# Patient Record
Sex: Female | Born: 1960 | Race: White | Hispanic: No | Marital: Married | Smoking: Never smoker
Health system: Southern US, Community
[De-identification: ages and names within clinical notes are randomized; demographics above are authoritative.]

## PROBLEM LIST (undated history)

## (undated) DIAGNOSIS — M797 Fibromyalgia: Secondary | ICD-10-CM

## (undated) DIAGNOSIS — R002 Palpitations: Secondary | ICD-10-CM

## (undated) DIAGNOSIS — Z780 Asymptomatic menopausal state: Secondary | ICD-10-CM

## (undated) DIAGNOSIS — Z464 Encounter for fitting and adjustment of orthodontic device: Secondary | ICD-10-CM

## (undated) DIAGNOSIS — T753XXA Motion sickness, initial encounter: Secondary | ICD-10-CM

## (undated) DIAGNOSIS — E785 Hyperlipidemia, unspecified: Secondary | ICD-10-CM

## (undated) DIAGNOSIS — R112 Nausea with vomiting, unspecified: Secondary | ICD-10-CM

## (undated) DIAGNOSIS — F32A Depression, unspecified: Secondary | ICD-10-CM

## (undated) DIAGNOSIS — M539 Dorsopathy, unspecified: Secondary | ICD-10-CM

## (undated) DIAGNOSIS — Z973 Presence of spectacles and contact lenses: Secondary | ICD-10-CM

## (undated) DIAGNOSIS — G4730 Sleep apnea, unspecified: Secondary | ICD-10-CM

## (undated) DIAGNOSIS — E559 Vitamin D deficiency, unspecified: Secondary | ICD-10-CM

## (undated) DIAGNOSIS — Z8489 Family history of other specified conditions: Secondary | ICD-10-CM

## (undated) DIAGNOSIS — IMO0001 Reserved for inherently not codable concepts without codable children: Secondary | ICD-10-CM

## (undated) DIAGNOSIS — F329 Major depressive disorder, single episode, unspecified: Secondary | ICD-10-CM

## (undated) DIAGNOSIS — Z9889 Other specified postprocedural states: Secondary | ICD-10-CM

## (undated) HISTORY — DX: Fibromyalgia: M79.7

## (undated) HISTORY — DX: Vitamin D deficiency, unspecified: E55.9

## (undated) HISTORY — DX: Palpitations: R00.2

## (undated) HISTORY — PX: ANTERIOR CERVICAL DECOMP/DISCECTOMY FUSION: SHX1161

## (undated) HISTORY — DX: Hyperlipidemia, unspecified: E78.5

## (undated) HISTORY — DX: Asymptomatic menopausal state: Z78.0

## (undated) HISTORY — DX: Depression, unspecified: F32.A

## (undated) HISTORY — DX: Major depressive disorder, single episode, unspecified: F32.9

## (undated) HISTORY — PX: TONSILLECTOMY: SHX5217

---

## 1998-08-10 ENCOUNTER — Other Ambulatory Visit: Admission: RE | Admit: 1998-08-10 | Discharge: 1998-08-10 | Payer: Self-pay | Admitting: Obstetrics & Gynecology

## 2001-03-19 ENCOUNTER — Other Ambulatory Visit: Admission: RE | Admit: 2001-03-19 | Discharge: 2001-03-19 | Payer: Self-pay | Admitting: Obstetrics & Gynecology

## 2002-07-22 ENCOUNTER — Ambulatory Visit (HOSPITAL_COMMUNITY): Admission: RE | Admit: 2002-07-22 | Discharge: 2002-07-22 | Payer: Self-pay | Admitting: Internal Medicine

## 2002-07-24 ENCOUNTER — Encounter: Payer: Self-pay | Admitting: *Deleted

## 2002-09-09 ENCOUNTER — Other Ambulatory Visit: Admission: RE | Admit: 2002-09-09 | Discharge: 2002-09-09 | Payer: Self-pay | Admitting: Obstetrics & Gynecology

## 2004-02-17 ENCOUNTER — Ambulatory Visit (HOSPITAL_COMMUNITY): Admission: RE | Admit: 2004-02-17 | Discharge: 2004-02-18 | Payer: Self-pay | Admitting: Neurosurgery

## 2007-07-08 ENCOUNTER — Observation Stay (HOSPITAL_COMMUNITY): Admission: RE | Admit: 2007-07-08 | Discharge: 2007-07-09 | Payer: Self-pay | Admitting: Neurosurgery

## 2009-08-21 ENCOUNTER — Ambulatory Visit: Payer: Self-pay | Admitting: Internal Medicine

## 2010-12-12 NOTE — Op Note (Signed)
NAMEHARMANI, Figueroa NO.:  [id number]   MEDICAL RECORD NO.:  [id number]          PATIENT TYPE:  INP   LOCATION:  2899                         FACILITY:  [facility]   PHYSICIAN:  Danae Orleans. Venetia Maxon, M.D.  DATE OF BIRTH:  1961-02-23   DATE OF PROCEDURE:  07/08/2007  DATE OF DISCHARGE:                               OPERATIVE REPORT   PREOPERATIVE DIAGNOSIS:  Cervical spondylosis with myelopathy, cervical  disc herniation with myelopathy, cervical radiculopathy, neck pain, C4-  C5 and C6-C7, with prior fusion C5-C6.   POSTOPERATIVE DIAGNOSIS:  Cervical spondylosis with myelopathy, cervical  disc herniation with myelopathy, cervical radiculopathy, neck pain, C4-  C5 and C6-C7, with prior fusion C5-C6.   PROCEDURE:  Exploration of fusion C5-C6, with removal of previously  placed hardware at this level and anterior cervical decompression and  fusion C4-C5 and C6-C7 with allograft bone graft autograft and anterior  cervical plates.   SURGEON:  Danae Orleans. Venetia Maxon, M.D.   ASSISTANT:  Hewitt Shorts, M.D.   ANESTHESIA:  General endotracheal anesthesia.   ESTIMATED BLOOD LOSS:  Minimal.   COMPLICATIONS:  None.   DISPOSITION:  Recovery room.   INDICATIONS FOR PROCEDURE:  Cheryl Figueroa is a 50 year old woman who  has previously undergone an anterior cervical decompression and fusion  at C5-C6.  She did well after the surgery but multiple years later has  developed significant neck and bilateral upper extremity pain with  myelopathy and was found to have significant spondylosis and disc  herniation with cord compression at C4-C5 and C6-C7 levels.  It was  elected to take her to surgery for exploration, fusion, and removal of  previous hardware with anterior cervical decompression and fusion C4-C5  and C6-C7 levels.   PROCEDURE:  Cheryl Figueroa was brought to the operating room.  Following  the satisfactory and uncomplicated induction of general endotracheal  anesthesia  and placement of intravenous lines, the patient was placed in  a supine position on the operating table.  Her neck was maintained in  neutral alignment.  She was placed in 5 pounds of halter traction.  Her  anterior neck was then prepped and draped in the usual sterile fashion.  The previous area of planned incision was infiltrated with 0.25%  Marcaine and 0.5% lidocaine with 1:200,000 epinephrine.  An incision was  made from the midline to the anterior border of the sternocleidomastoid  muscle through her previous incision and carried sharply through the  platysmal layer.  Subplatysmal dissection was performed exposing the  anterior border of the sternocleidomastoid muscle using blunt  dissection.  The carotid sheath was kept lateral, the trachea and  esophagus kept medial, exposing the anterior cervical spine.  A previous  plate, which was encased in scar tissue, was exposed and subsequently  removed.  The bone graft interfaces with the previous fusion were  identified and inspected carefully under loupe magnification and there  appeared to be a solid arthrodesis with no evidence of movement.  An x-  ray was obtained with a marker probe at the C4-C5 level.  Subsequently,  initially at C6-C7 and then at C4-C5,  the longus colli muscles were  taken down from the anterior cervical spine and ventral osteophytes were  removed, the disc spaces were incised, and disc material was removed in  a piecemeal fashion.  Distraction pins were placed, initially at C6-C7  and subsequently at C4 and C5, and the interspace was further evacuated  of disc material.  A high speed drill was used with suction trap and the  interspaces decorticated and uncinate spurs were drilled down.  Subsequently, under the microscope, the posterior longitudinal ligament  was decompressed and removed along with uncinate spurs and the spinal  cord dura and both C7 nerve roots were widely decompressed.  Hemostasis  was assured  and after trial sizing, a 7 mm allograft bone graft was  selected, fashioned with a high speed drill, packed with morcellized  bone autograft, and inserted in the interspace and counter sunk  appropriately.  Attention was turned to the C4-C5 level where a similar  decompression was performed and, at this level, again the interspace was  evacuated and endplates decorticated and uncinate spurs drilled down.  The posterior longitudinal ligament was incised and removed in a  piecemeal fashion with significant decompression of the spinal cord dura  and both C5 neural foramina.  Hemostasis was again assured and, after  trial sizing a 6 mm, allograft bone wedge was selected, fashion with a  high speed drill, packed with morcellized bone autograft, inserted into  the interspace, and counter sunk appropriately.  Two 14 mm tressel  plates were then placed, one at C4-C5 and the other at C6-C7 with 14 mm  screws.  All screws had excellent purchase.  Locking mechanisms were  engaged.  Final x-ray demonstrated well positioned upper aspect but it  was not possible to visualize the lower aspect of the construct.  The  traction layer was removed prior to placing the plate.  Hemostasis was  then assured.  The soft tissue was inspected and found to be in good  repair with no evidence of any bleeding.  The platysmal layer was closed  with 3-0 Vicryl sutures and the skin edges were reapproximated with 3-0  Vicryl interrupted inverted sutures.  The wound was dressed with  Dermabond.  The patient was extubated in the operating room and taken to  the recovery room in stable condition having tolerated the operation  well.  Counts were correct at the end of the case.      Danae Orleans. Venetia Maxon, M.D.  Electronically Signed     JDS/MEDQ  D:  07/08/2007  T:  07/08/2007  Job:  [id number]

## 2010-12-15 NOTE — Op Note (Signed)
NAME:  Cheryl Figueroa, Cheryl Figueroa                      ACCOUNT NO.:  [id number]   MEDICAL RECORD NO.:  [id number]                   PATIENT TYPE:  OIB   LOCATION:  2891                                 FACILITY:  [facility]   PHYSICIAN:  Danae Orleans. Venetia Maxon, M.D.               DATE OF BIRTH:  1961/04/02   DATE OF PROCEDURE:  02/17/2004  DATE OF DISCHARGE:                                 OPERATIVE REPORT   PREOPERATIVE DIAGNOSIS:  Herniated cervical disk with cervical spondylosis,  spinal stenosis, degenerative disk disease and cervical radiculopathy, C5-6  level.   POSTOPERATIVE DIAGNOSIS:  Herniated cervical disk with cervical spondylosis,  spinal stenosis, degenerative disk disease and cervical radiculopathy, C5-6  level.   PROCEDURE:  Anterior cervical decompression and fusion, C5-6 level, with  allograft bone graft and anterior cervical plate.   SURGEON:  Danae Orleans. Venetia Maxon, M.D.   ASSISTANT:  Cristi Loron, M.D.   ANESTHESIA:  General endotracheal anesthesia.   ESTIMATED BLOOD LOSS:  Minimal.   COMPLICATIONS:  None.   DISPOSITION:  To recovery.   INDICATIONS:  Cheryl Figueroa is a 50 year old woman with significant  cervical spondylosis, spinal stenosis and cervical radiculopathy at C6  level.  She has milder spondylosis at C4-5 and C6-7 levels but there is not  nearly as severe spinal cord compression and she does not have radicular  symptoms relating to those levels.  It was elected to take her to surgery  for anterior cervical decompression and fusion at the C5-6 level.   PROCEDURE:  Cheryl Figueroa was brought to the operating room.  Following  satisfactory and uncomplicated induction of general endotracheal anesthesia  and placement of intravenous lines, the patient was placed in a supine  position on the operating table.  Her neck was placed in slight extension  and she was placed in 10 pounds of halter traction.  Her anterior neck was  then prepped and draped in the usual  sterile fashion.  The area of planned  incision was infiltrated with 0.25% Marcaine, 0.5% lidocaine and 1:200,000  epinephrine.  Incision was made from the midline to the anterior border of  the sternocleidomastoid muscle on the left side of the midline and carried  through platysmal layers sharply.  Subplatysmal dissection was performed,  exposing the anterior border of the sternocleidomastoid muscle.  Using blunt  dissection, the carotid sheath was kept lateral, trachea and esophagus kept  medial, exposing the anterior cervical spine.  A bent spinal needle was  placed in what was felt to be the C5-6 level and this was confirmed by  intraoperative x-ray.  Subsequently, the longus colli muscles were taken  down from the anterior cervical spine from C5 through C6 levels bilaterally  using electrocautery and a key elevator and a self-retaining Shadowline  retractor was placed to facilitate exposure.  A Leksell rongeur was used to  remove ventral osteophytes and the interspace was incised using a 15 blade  and disk material was removed in a piecemeal fashion.  The disk was highly  degenerated; there was also a significant osteophyte overgrowth and this was  removed under the microscope using a high-speed drill and a 2-mm gold-tipped  Kerrison rongeurs.  The posterior longitudinal ligament was removed, as were  the osteophytic spurs, and both C6 nerve roots were decompressed widely as  they extended up the neural foramina.  Hemostasis was obtained with Gelfoam  soaked in thrombin.  There was a small amount of herniated disk material  directly over the C6 nerve root on the left and this was decompressed.  After using trial sizers, an 8-mm machined cortical bone wedge was packed  with the morcelized bone drillings removed from the interspace and then  placed in the interspace and countersunk appropriately.  The halter traction  was removed.  The microscope was taken out of the field.  A 16-mm  Alphatec  anterior cervical plate was then affixed to the anterior cervical spine  using 14-mm variable-angle screws, 2 at C5, 2 at C6.  All screws had  excellent purchase.  Locking mechanisms were engaged.  Final x-ray confirmed  well-positioned bone graft and anterior cervical plate.  Hemostasis was  assured.  Soft tissues were inspected and were found to be in good repair.  The self-retaining retractor was removed.  The platysmal layer was closed  with 3-0 Vicryl sutures, subcutaneous tissues reapproximated with 3-0 Vicryl  interrupted inverted sutures and the wound was dressed with Dermabond.  The  patient was extubated in the operating room and taken to the recovery room  in stable and satisfactory condition, having tolerated her operation well.  Counts were correct at the end of the case.                                               Danae Orleans. Venetia Maxon, M.D.    JDS/MEDQ  D:  02/17/2004  T:  02/18/2004  Job:  [id number]

## 2011-05-07 LAB — BASIC METABOLIC PANEL
BUN: 5 — ABNORMAL LOW
CO2: 25
Calcium: 9.6
Chloride: 102
Creatinine, Ser: 0.78
GFR calc Af Amer: >60
GFR calc non Af Amer: >60
Glucose, Bld: 98
Potassium: 3.7
Sodium: 136

## 2011-05-07 LAB — CBC
MCHC: 34
RDW: 15.3

## 2011-08-01 LAB — HM MAMMOGRAPHY: HM MAMMO: NORMAL

## 2011-11-01 ENCOUNTER — Other Ambulatory Visit (HOSPITAL_COMMUNITY): Payer: Self-pay | Admitting: Neurosurgery

## 2011-11-01 DIAGNOSIS — M899 Disorder of bone, unspecified: Secondary | ICD-10-CM

## 2011-11-02 ENCOUNTER — Other Ambulatory Visit: Payer: Self-pay | Admitting: Neurosurgery

## 2011-11-02 DIAGNOSIS — M899 Disorder of bone, unspecified: Secondary | ICD-10-CM

## 2011-11-19 ENCOUNTER — Other Ambulatory Visit: Payer: Self-pay

## 2012-03-28 ENCOUNTER — Other Ambulatory Visit: Payer: Self-pay | Admitting: Obstetrics & Gynecology

## 2012-03-28 DIAGNOSIS — R928 Other abnormal and inconclusive findings on diagnostic imaging of breast: Secondary | ICD-10-CM

## 2012-04-02 ENCOUNTER — Ambulatory Visit
Admission: RE | Admit: 2012-04-02 | Discharge: 2012-04-02 | Disposition: A | Discharge status: Home / Self Care | Payer: Commercial Indemnity | Source: Ambulatory Visit | Attending: Obstetrics & Gynecology | Admitting: Obstetrics & Gynecology

## 2012-04-02 DIAGNOSIS — R928 Other abnormal and inconclusive findings on diagnostic imaging of breast: Secondary | ICD-10-CM

## 2012-07-17 ENCOUNTER — Ambulatory Visit: Payer: Self-pay

## 2012-07-17 LAB — RAPID INFLUENZA A&B ANTIGENS

## 2014-07-20 ENCOUNTER — Other Ambulatory Visit: Payer: Self-pay | Admitting: Obstetrics & Gynecology

## 2014-07-20 DIAGNOSIS — N6489 Other specified disorders of breast: Secondary | ICD-10-CM

## 2014-07-20 DIAGNOSIS — N6459 Other signs and symptoms in breast: Secondary | ICD-10-CM

## 2014-08-02 ENCOUNTER — Ambulatory Visit
Admission: RE | Admit: 2014-08-02 | Discharge: 2014-08-02 | Disposition: A | Discharge status: Home / Self Care | Payer: Commercial Indemnity | Source: Ambulatory Visit | Attending: Obstetrics & Gynecology | Admitting: Obstetrics & Gynecology

## 2014-08-02 ENCOUNTER — Encounter (INDEPENDENT_AMBULATORY_CARE_PROVIDER_SITE_OTHER): Payer: Self-pay

## 2014-08-02 DIAGNOSIS — N6459 Other signs and symptoms in breast: Secondary | ICD-10-CM

## 2014-08-02 DIAGNOSIS — N6489 Other specified disorders of breast: Secondary | ICD-10-CM

## 2014-12-15 LAB — LIPID PANEL
Cholesterol: 285 mg/dL — AB (ref 0–200)
HDL: 52 mg/dL (ref 35–70)
LDL Cholesterol: 198 mg/dL
Triglycerides: 177 mg/dL — AB (ref 40–160)

## 2014-12-15 LAB — TSH: TSH: 3.3 u[IU]/mL (ref <=5.90)

## 2014-12-15 LAB — BASIC METABOLIC PANEL
BUN: 14 mg/dL (ref 4–21)
Creatinine: 0.8 mg/dL (ref <=1.1)

## 2015-02-01 ENCOUNTER — Ambulatory Visit (INDEPENDENT_AMBULATORY_CARE_PROVIDER_SITE_OTHER): Payer: Commercial Indemnity | Admitting: Cardiovascular Disease

## 2015-02-01 ENCOUNTER — Encounter (INDEPENDENT_AMBULATORY_CARE_PROVIDER_SITE_OTHER): Payer: Self-pay

## 2015-02-01 ENCOUNTER — Encounter: Payer: Self-pay | Admitting: Cardiovascular Disease

## 2015-02-01 VITALS — BP 146/84 | HR 69 | Ht 62.0 in | Wt 186.5 lb

## 2015-02-01 DIAGNOSIS — R0602 Shortness of breath: Secondary | ICD-10-CM | POA: Insufficient documentation

## 2015-02-01 DIAGNOSIS — Z8249 Family history of ischemic heart disease and other diseases of the circulatory system: Secondary | ICD-10-CM | POA: Diagnosis not present

## 2015-02-01 DIAGNOSIS — E669 Obesity, unspecified: Secondary | ICD-10-CM | POA: Insufficient documentation

## 2015-02-01 DIAGNOSIS — E785 Hyperlipidemia, unspecified: Secondary | ICD-10-CM | POA: Diagnosis not present

## 2015-02-01 DIAGNOSIS — R079 Chest pain, unspecified: Secondary | ICD-10-CM | POA: Diagnosis not present

## 2015-02-01 NOTE — Assessment & Plan Note (Signed)
We have encouraged continued exercise, careful diet management in an effort to lose weight. 

## 2015-02-01 NOTE — Patient Instructions (Addendum)
No medication changes were made.  We will order a CT coronary calcium score for chest pain, shortness of breath, family hx  Non-Cardiac CT scanning, (CAT scanning), is a noninvasive, special x-ray that produces cross-sectional images of the body using x-rays and a computer. CT scans help physicians diagnose and treat medical conditions. For some CT exams, a contrast material is used to enhance visibility in the area of the body being studied. CT scans provide greater clarity and reveal more details than regular x-ray exams.  You are scheduled for Monday, July 11 @ 4:00, please arrive @ 3:45 There is a one-time fee of $150.00 due at the time of your procedure  Please call us if you have new issues that need to be addressed before your next appt.

## 2015-02-01 NOTE — Assessment & Plan Note (Signed)
CT coronary calcium score will help guide management of her hyperlipidemia. Cholesterol is very high at this time. Likely large genetic component

## 2015-02-01 NOTE — Assessment & Plan Note (Signed)
She does report a strong family history. Several members of family have coronary disease, elevated cholesterol.

## 2015-02-01 NOTE — Assessment & Plan Note (Signed)
Atypical in nature. Recommend she start a regular exercise program once the results of her CT scan are known. Possibly related to recent increase in weight

## 2015-02-01 NOTE — Assessment & Plan Note (Signed)
Currently with chest pain symptoms with some typical as well as atypical features. For further risk stratification, we have ordered a CT coronary calcium score in [place]. Other treatment options were also discussed with her including treadmill testing. She prefers CT scanning. If symptoms get worse, recommended she call our office

## 2015-02-01 NOTE — Progress Notes (Signed)
Patient ID: Cheryl Figueroa, female    DOB: [birth year], 54 y.o.   MRN: [id number]  HPI Comments: Cheryl Figueroa is a 54 year old woman with history of hyperlipidemia, obesity who presents for evaluation of chest pain and shortness of breath.  She reports that may 19th 2016 she presented to primary care with symptoms of chest pain and shortness of breath.  She was having symptoms at rest and with exertion. She described a cramping in the center of her chest, sometimes to the left. Sometimes it hurts to take a breath in. Sometimes had pain down her left arm. Unclear if anything made her symptoms better other than resting  Symptoms seem to have gotten a little bit better over the last month or so but still having symptoms at least once per week.  EKG on today's visit shows normal sinus rhythm with rate 69 bpm, no significant ST or T-wave changes.  Review of her lab work shows total cholesterol 285, LDL 198, HDL 52 Normal LFTs, BMP, CBC. Low vitamin D   Allergies  Allergen Reactions  . Codeine     No current outpatient prescriptions on file prior to visit.   No current facility-administered medications on file prior to visit.    Past Medical History  Diagnosis Date  . Hyperlipidemia   . Minor depression   . Menopause   . Vitamin D deficiency   . Fibromyalgia   . Intermittent palpitations     Past Surgical History  Procedure Laterality Date  . Cesarean section    . Tonsillectomy    . Ruptured disc surgery      Social History  reports that she has never smoked. She does not have any smokeless tobacco history on file. She reports that she does not drink alcohol or use illicit drugs.  Family History family history includes Diabetes in her father; Hypertension in her mother.  Review of Systems  Constitutional: Negative.   HENT: Negative.   Eyes: Negative.   Respiratory: Positive for chest tightness and shortness of breath.   Cardiovascular: Positive for chest pain.   Gastrointestinal: Negative.   Endocrine: Negative.   Musculoskeletal: Negative.   Skin: Negative.   Allergic/Immunologic: Negative.   Neurological: Negative.   Hematological: Negative.   Psychiatric/Behavioral: Negative.   All other systems reviewed and are negative.  BP 146/84 mmHg  Pulse 69  Ht 5\' 2"  (1.575 m)  Wt 186 lb 8 oz (84.596 kg)  BMI 34.10 kg/m2  Physical Exam  Constitutional: She is oriented to person, place, and time. She appears well-developed and well-nourished.  HENT:  Head: Normocephalic.  Nose: Nose normal.  Mouth/Throat: Oropharynx is clear and moist.  Eyes: Conjunctivae are normal. Pupils are equal, round, and reactive to light.  Neck: Normal range of motion. Neck supple. No JVD present.  Cardiovascular: Normal rate, regular rhythm, normal heart sounds and intact distal pulses.  Exam reveals no gallop and no friction rub.   No murmur heard. Pulmonary/Chest: Effort normal and breath sounds normal. No respiratory distress. She has no wheezes. She has no rales. She exhibits no tenderness.  Abdominal: Soft. Bowel sounds are normal. She exhibits no distension. There is no tenderness.  Musculoskeletal: Normal range of motion. She exhibits no edema or tenderness.  Lymphadenopathy:    She has no cervical adenopathy.  Neurological: She is alert and oriented to person, place, and time. Coordination normal.  Skin: Skin is warm and dry. No rash noted. No erythema.  Psychiatric: She has a normal mood  and affect. Her behavior is normal. Judgment and thought content normal.

## 2015-02-07 ENCOUNTER — Ambulatory Visit (INDEPENDENT_AMBULATORY_CARE_PROVIDER_SITE_OTHER)
Admission: RE | Admit: 2015-02-07 | Discharge: 2015-02-07 | Disposition: A | Discharge status: Home / Self Care | Payer: Commercial Indemnity | Source: Ambulatory Visit | Attending: Cardiovascular Disease | Admitting: Cardiovascular Disease

## 2015-02-07 DIAGNOSIS — R079 Chest pain, unspecified: Secondary | ICD-10-CM

## 2015-02-07 DIAGNOSIS — Z8249 Family history of ischemic heart disease and other diseases of the circulatory system: Secondary | ICD-10-CM

## 2015-02-07 DIAGNOSIS — R0602 Shortness of breath: Secondary | ICD-10-CM

## 2015-02-10 NOTE — Progress Notes (Signed)
Given her score is 0, would be okay to do diet and exercise

## 2015-03-31 ENCOUNTER — Other Ambulatory Visit: Payer: Self-pay

## 2015-03-31 MED ORDER — DULOXETINE HCL 60 MG PO CPEP: 60.0000 mg | ORAL_CAPSULE | Freq: Every day | ORAL | Status: DC

## 2015-04-05 ENCOUNTER — Other Ambulatory Visit: Payer: Self-pay

## 2015-05-02 ENCOUNTER — Telehealth: Payer: Self-pay | Admitting: *Deleted

## 2015-05-02 NOTE — Telephone Encounter (Signed)
Error

## 2015-06-11 ENCOUNTER — Encounter: Payer: Self-pay | Admitting: Internal Medicine

## 2015-06-18 ENCOUNTER — Other Ambulatory Visit: Payer: Self-pay | Admitting: Internal Medicine

## 2015-06-18 ENCOUNTER — Encounter: Payer: Self-pay | Admitting: Internal Medicine

## 2015-06-18 DIAGNOSIS — R0789 Other chest pain: Secondary | ICD-10-CM | POA: Insufficient documentation

## 2015-07-11 ENCOUNTER — Ambulatory Visit: Payer: Self-pay | Admitting: Internal Medicine

## 2015-07-15 ENCOUNTER — Ambulatory Visit (INDEPENDENT_AMBULATORY_CARE_PROVIDER_SITE_OTHER): Payer: Commercial Indemnity | Admitting: Internal Medicine

## 2015-07-15 ENCOUNTER — Encounter: Payer: Self-pay | Admitting: Internal Medicine

## 2015-07-15 VITALS — BP 126/78 | HR 72 | Ht 62.0 in | Wt 181.0 lb

## 2015-07-15 DIAGNOSIS — Z23 Encounter for immunization: Secondary | ICD-10-CM | POA: Diagnosis not present

## 2015-07-15 DIAGNOSIS — M89319 Hypertrophy of bone, unspecified shoulder: Secondary | ICD-10-CM

## 2015-07-15 DIAGNOSIS — M898X8 Other specified disorders of bone, other site: Secondary | ICD-10-CM | POA: Diagnosis not present

## 2015-07-15 NOTE — Progress Notes (Signed)
Date:  07/15/2015   Name:  Cheryl Figueroa   DOB:  06-15-1961   MRN:  [id number]   Chief Complaint: No chief complaint on file.  Patient noticed a knot on her right collarbone about 2 weeks ago. Slightly tender but unchanged in size. She's not aware of any injury. She has no trouble swallowing, no sore throat, and no redness of the area.  Review of Systems  Constitutional: Negative for fever and chills.  Respiratory: Negative for cough, chest tightness and shortness of breath.   Cardiovascular: Negative for chest pain.  Musculoskeletal: Negative for gait problem.  Hematological: Negative for adenopathy. Does not bruise/bleed easily.    Patient Active Problem List   Diagnosis Date Noted  . Atypical chest pain 06/18/2015  . Pain in the chest 02/01/2015  . SOB (shortness of breath) 02/01/2015  . Family history of early CAD 02/01/2015  . Hyperlipidemia 02/01/2015  . Obesity 02/01/2015    Prior to Admission medications   Medication Sig Start Date End Date Taking? Authorizing Provider  DULoxetine (CYMBALTA) 60 MG capsule Take 1 capsule (60 mg total) by mouth daily. 03/31/15  Yes Glean Hess, MD  Cholecalciferol (VITAMIN D3) 5000 UNITS CAPS Take 1 capsule by mouth daily.    Historical Provider, MD    Allergies  Allergen Reactions  . Codeine   . Meperidine   . Oxycodone Hcl     Past Surgical History  Procedure Laterality Date  . Cesarean section    . Tonsillectomy    . Ruptured disc surgery      Social History  Substance Use Topics  . Smoking status: Never Smoker   . Smokeless tobacco: None  . Alcohol Use: No    Medication list has been reviewed and updated.   Physical Exam  Constitutional: She is oriented to person, place, and time. She appears well-developed. No distress.  HENT:  Head: Normocephalic and atraumatic.  Eyes: Conjunctivae are normal. Right eye exhibits no discharge. Left eye exhibits no discharge. No scleral icterus.  Cardiovascular: Normal rate,  regular rhythm and normal heart sounds.   Pulmonary/Chest: Effort normal. No respiratory distress.  Musculoskeletal: Normal range of motion.       Arms: Neurological: She is alert and oriented to person, place, and time.  Skin: Skin is warm and dry. No rash noted.  Psychiatric: She has a normal mood and affect. Her behavior is normal. Thought content normal.  Nursing note and vitals reviewed.   BP 126/78 mmHg  Pulse 72  Ht 5\' 2"  (1.575 m)  Wt 181 lb (82.101 kg)  BMI 33.10 kg/m2  Assessment and Plan: 1. Clavicular enlargement Patient reassured; monitor for change and use anti-inflammatories as needed  2. Flu vaccine need - Flu Vaccine QUAD 36+ mos PF IM (Fluarix & Fluzone Quad PF)   Halina Maidens, MD [facility]  07/15/2015

## 2015-11-29 ENCOUNTER — Ambulatory Visit (INDEPENDENT_AMBULATORY_CARE_PROVIDER_SITE_OTHER): Payer: Managed Care, Other (non HMO) | Admitting: Internal Medicine

## 2015-11-29 ENCOUNTER — Encounter: Payer: Self-pay | Admitting: Internal Medicine

## 2015-11-29 VITALS — BP 150/86 | HR 75 | Temp 98.4°F | Resp 16 | Ht 62.0 in | Wt 185.0 lb

## 2015-11-29 DIAGNOSIS — J40 Bronchitis, not specified as acute or chronic: Secondary | ICD-10-CM

## 2015-11-29 DIAGNOSIS — M797 Fibromyalgia: Secondary | ICD-10-CM

## 2015-11-29 MED ORDER — DULOXETINE HCL 30 MG PO CPEP: 90.0000 mg | ORAL_CAPSULE | Freq: Every day | ORAL | Status: DC

## 2015-11-29 MED ORDER — AMOXICILLIN-POT CLAVULANATE 875-125 MG PO TABS: 1.0000 | ORAL_TABLET | Freq: Two times a day (BID) | ORAL | Status: DC

## 2015-11-29 NOTE — Progress Notes (Signed)
Date:  11/29/2015   Name:  Cheryl Figueroa   DOB:  Apr 28, 1961   MRN:  [id number]   Chief Complaint: Cough; Fever; and Generalized Body Aches Cough This is a new problem. The current episode started in the past 7 days. The problem has been unchanged. The cough is non-productive (initially but then thicker and yellow yesterday). Associated symptoms include a fever, headaches, myalgias, postnasal drip and a sore throat.  Fever  This is a new problem. The current episode started in the past 7 days. The problem has been gradually improving. Her temperature was unmeasured prior to arrival. Associated symptoms include congestion, coughing, headaches, muscle aches and a sore throat. Pertinent negatives include no abdominal pain.  Fibromyalgia - had done great on cymbalta but recently has noticed more moodiness. She is wondering about trying Lyrica instead because she is concerned that the cymbalta is making her more depressed.  Review of Systems  Constitutional: Positive for fever.  HENT: Positive for congestion, postnasal drip and sore throat.   Eyes: Negative for visual disturbance.  Respiratory: Positive for cough.   Gastrointestinal: Negative for abdominal pain.  Musculoskeletal: Positive for myalgias.  Neurological: Positive for headaches.  Psychiatric/Behavioral: Positive for dysphoric mood.    Patient Active Problem List   Diagnosis Date Noted  . Atypical chest pain 06/18/2015  . Pain in the chest 02/01/2015  . SOB (shortness of breath) 02/01/2015  . Family history of early CAD 02/01/2015  . Hyperlipidemia 02/01/2015  . Obesity 02/01/2015    Prior to Admission medications   Medication Sig Start Date End Date Taking? Authorizing Provider  DULoxetine (CYMBALTA) 60 MG capsule Take 1 capsule (60 mg total) by mouth daily. 03/31/15  Yes Glean Hess, MD    Allergies  Allergen Reactions  . Codeine   . Meperidine   . Oxycodone Hcl     Past Surgical History  Procedure  Laterality Date  . Cesarean section    . Tonsillectomy    . Ruptured disc surgery      Social History  Substance Use Topics  . Smoking status: Never Smoker   . Smokeless tobacco: None  . Alcohol Use: No    Medication list has been reviewed and updated.  Physical Exam  Constitutional: She is oriented to person, place, and time. She appears well-developed. No distress.  HENT:  Head: Normocephalic and atraumatic.  Cardiovascular: Normal rate, regular rhythm and normal heart sounds.   Pulmonary/Chest: Effort normal. No respiratory distress. She has decreased breath sounds in the right upper field. She has no wheezes. She has no rhonchi.  Musculoskeletal: Normal range of motion.  Neurological: She is alert and oriented to person, place, and time.  Skin: Skin is warm and dry. No rash noted.  Psychiatric: She has a normal mood and affect. Her behavior is normal. Thought content normal.  Nursing note and vitals reviewed.   BP 160/90 mmHg  Pulse 75  Temp(Src) 98.4 F (36.9 C) (Oral)  Resp 16  Ht 5\' 2"  (1.575 m)  Wt 185 lb (83.915 kg)  BMI 33.83 kg/m2  SpO2 98%  LMP 10/29/2015 (LMP Unknown)  Assessment and Plan: 1. Bronchitis Suspect influenza initially and now early bacterial bronchitis - amoxicillin-clavulanate (AUGMENTIN) 875-125 MG tablet; Take 1 tablet by mouth 2 (two) times daily.  Dispense: 20 tablet; Refill: 0  2. Fibromyalgia With mild increase is mood disorder - will increase to 90 mg per day - DULoxetine (CYMBALTA) 30 MG capsule; Take 3 capsules (90 mg  total) by mouth daily.  Dispense: 90 capsule; Refill: [place], MD [facility]  11/29/2015

## 2015-12-02 ENCOUNTER — Telehealth: Payer: Self-pay

## 2015-12-02 NOTE — Telephone Encounter (Signed)
Patient felt better Thursday but now is having fever and feeling worse as if it has a l started all over again. Patient taking amoxicillin but wonders if she needs different antibiotic.Also was waiting on refill on 30 mg Cymbalta for Fibromyalgia. She will take this with her 60 mg Cymbalta.

## 2015-12-02 NOTE — Telephone Encounter (Signed)
Advised 

## 2015-12-02 NOTE — Telephone Encounter (Signed)
It has only been three days on the antibiotics and it will take the full 10 days to fully treat the bronchitis. Also, I think you are still having low grade fever because you also had influenza.  Take tylenol 3-4 times per day and drink plenty of fluids. I sent in a prescription for Cymbalta 30 mg #90 so you can take 3 of the 30 mg = 90 mg and only have one copay.  I did this at your visit on 11/29/15.

## 2016-01-03 ENCOUNTER — Other Ambulatory Visit: Payer: Self-pay | Admitting: Obstetrics & Gynecology

## 2016-01-03 DIAGNOSIS — R928 Other abnormal and inconclusive findings on diagnostic imaging of breast: Secondary | ICD-10-CM

## 2016-01-06 ENCOUNTER — Ambulatory Visit
Admission: RE | Admit: 2016-01-06 | Discharge: 2016-01-06 | Disposition: A | Discharge status: Home / Self Care | Payer: Commercial Indemnity | Source: Ambulatory Visit | Attending: Obstetrics & Gynecology | Admitting: Obstetrics & Gynecology

## 2016-01-06 DIAGNOSIS — R928 Other abnormal and inconclusive findings on diagnostic imaging of breast: Secondary | ICD-10-CM

## 2016-06-27 ENCOUNTER — Ambulatory Visit (INDEPENDENT_AMBULATORY_CARE_PROVIDER_SITE_OTHER): Payer: Managed Care, Other (non HMO) | Admitting: Internal Medicine

## 2016-06-27 ENCOUNTER — Encounter: Payer: Self-pay | Admitting: Internal Medicine

## 2016-06-27 VITALS — BP 142/80 | HR 84 | Resp 16 | Ht 62.0 in | Wt 199.0 lb

## 2016-06-27 DIAGNOSIS — Z1211 Encounter for screening for malignant neoplasm of colon: Secondary | ICD-10-CM

## 2016-06-27 DIAGNOSIS — M797 Fibromyalgia: Secondary | ICD-10-CM

## 2016-06-27 DIAGNOSIS — K5901 Slow transit constipation: Secondary | ICD-10-CM | POA: Diagnosis not present

## 2016-06-27 DIAGNOSIS — F39 Unspecified mood [affective] disorder: Secondary | ICD-10-CM | POA: Diagnosis not present

## 2016-06-27 MED ORDER — DULOXETINE HCL 30 MG PO CPEP: 90.0000 mg | ORAL_CAPSULE | Freq: Every day | ORAL | 2 refills | Status: DC

## 2016-06-27 MED ORDER — LINACLOTIDE 290 MCG PO CAPS: 290.0000 ug | ORAL_CAPSULE | Freq: Every day | ORAL | 1 refills | Status: DC

## 2016-06-27 NOTE — Progress Notes (Signed)
Date:  06/27/2016   Name:  Cheryl Figueroa   DOB:  02-05-1961   MRN:  [id number]   Chief Complaint: Constipation (Sharp pain across back feeling like contractions. Had small BM yesterday But has been many days since she went.  ); Bloated; and Depression (Says med is causing depression and I advised this is sick visit but she wants to do folllow up today. )  Constipation  This is a chronic problem. The problem has been gradually worsening since onset. Her stool frequency is 1 time per week or less. The stool is described as pellet like. She does not exercise regularly. There has not been adequate water intake. Associated symptoms include abdominal pain. Pertinent negatives include no fever, flatus, hematochezia, rectal pain or vomiting. She has tried laxatives, fiber and stool softeners for the symptoms. There is no history of abdominal surgery or inflammatory bowel disease. (No prior colonoscopy)   Fibromyalgia - on cymbalta 90 mg per day.  Sx are fairly stable but she still has generalized pains, fatigue, poor sleep and decreased concentration.  Mood Disorder - she feels that her mood is often depressed, often for several weeks at a time.  She denies suicidal thoughts.  She tries to find activities to gain her interest and to stay active.    Review of Systems  Constitutional: Positive for fatigue and unexpected weight change. Negative for chills and fever.  Eyes: Negative for visual disturbance.  Respiratory: Negative for cough, chest tightness, shortness of breath and wheezing.   Cardiovascular: Negative for chest pain, palpitations and leg swelling.  Gastrointestinal: Positive for abdominal pain and constipation. Negative for blood in stool, flatus, hematochezia, rectal pain and vomiting.  Musculoskeletal: Positive for myalgias.  Neurological: Negative for dizziness and headaches.  Psychiatric/Behavioral: Positive for dysphoric mood and sleep disturbance. Negative for suicidal ideas.      Patient Active Problem List   Diagnosis Date Noted  . Fibromyalgia 11/29/2015  . Atypical chest pain 06/18/2015  . Pain in the chest 02/01/2015  . SOB (shortness of breath) 02/01/2015  . Family history of early CAD 02/01/2015  . Hyperlipidemia 02/01/2015  . Obesity 02/01/2015    Prior to Admission medications   Medication Sig Start Date End Date Taking? Authorizing Provider  DULoxetine (CYMBALTA) 30 MG capsule Take 3 capsules (90 mg total) by mouth daily. 11/29/15  Yes Glean Hess, MD    Allergies  Allergen Reactions  . Codeine   . Meperidine   . Oxycodone Hcl     Past Surgical History:  Procedure Laterality Date  . CESAREAN SECTION    . ruptured disc surgery    . TONSILLECTOMY      Social History  Substance Use Topics  . Smoking status: Never Smoker  . Smokeless tobacco: Never Used  . Alcohol use No     Medication list has been reviewed and updated.   Physical Exam  Constitutional: She is oriented to person, place, and time. She appears well-developed and well-nourished. No distress.  HENT:  Head: Normocephalic and atraumatic.  Neck: Normal range of motion. Neck supple. No thyromegaly present.  Cardiovascular: Normal rate, regular rhythm and normal heart sounds.   Pulmonary/Chest: Effort normal and breath sounds normal. No respiratory distress.  Abdominal: Soft. She exhibits distension. Bowel sounds are decreased. There is no hepatosplenomegaly. There is no tenderness. There is no rigidity, no rebound, no guarding and no tenderness at McBurney's point.  Musculoskeletal: Normal range of motion.  Neurological: She is alert and  oriented to person, place, and time.  Skin: Skin is warm and dry. No rash noted.  Psychiatric: Her speech is normal and behavior is normal. Thought content normal. Her affect is blunt. Cognition and memory are normal.  Nursing note and vitals reviewed.   BP (!) 142/80   Pulse 84   Resp 16   Ht 5\' 2"  (1.575 m)   Wt 199 lb (90.3  kg)   LMP 04/29/2016 (Approximate)   BMI 36.40 kg/m   Assessment and Plan: 1. Slow transit constipation Begin linzess daily Follow up in one month - linaclotide (LINZESS) 290 MCG CAPS capsule; Take 1 capsule (290 mcg total) by mouth daily before breakfast.  Dispense: 30 capsule; Refill: 1  2. Colon cancer screening - Ambulatory referral to Gastroenterology  3. Fibromyalgia Continue cymbalta Consider Lyrica next visit - DULoxetine (CYMBALTA) 30 MG capsule; Take 3 capsules (90 mg total) by mouth daily.  Dispense: 90 capsule; Refill: 2  4. Mood disorder [facility]) May improve spontaneously or may need further treatment Doubt that Cymbalta is worsening sx   Halina Maidens, MD [facility]  06/27/2016

## 2016-07-25 ENCOUNTER — Ambulatory Visit: Payer: Managed Care, Other (non HMO) | Admitting: Internal Medicine

## 2016-08-01 ENCOUNTER — Ambulatory Visit: Payer: Managed Care, Other (non HMO) | Admitting: Internal Medicine

## 2016-08-06 ENCOUNTER — Telehealth: Payer: Self-pay | Admitting: Gastroenterology

## 2016-08-06 ENCOUNTER — Ambulatory Visit: Payer: Managed Care, Other (non HMO) | Admitting: Internal Medicine

## 2016-08-06 NOTE — Telephone Encounter (Signed)
Patient left a voice message to  Reschedule her colonoscopy which she stated was 1/26 to another day. I don't see an appointment but I do see a referral.  Please call

## 2016-08-07 ENCOUNTER — Ambulatory Visit: Payer: Managed Care, Other (non HMO) | Admitting: Internal Medicine

## 2016-08-08 ENCOUNTER — Other Ambulatory Visit: Payer: Self-pay

## 2016-08-08 NOTE — Telephone Encounter (Signed)
Gastroenterology Pre-Procedure Review  Request Date:  Requesting Physician: Dr.   PATIENT REVIEW QUESTIONS: The patient responded to the following health history questions as indicated:    1. Are you having any GI issues? no 2. Do you have a personal history of Polyps? no 3. Do you have a family history of Colon Cancer or Polyps? no 4. Diabetes Mellitus? no 5. Joint replacements in the past 12 months?no 6. Major health problems in the past 3 months?no 7. Any artificial heart valves, MVP, or defibrillator?no    MEDICATIONS & ALLERGIES:    Patient reports the following regarding taking any anticoagulation/antiplatelet therapy:   Plavix, Coumadin, Eliquis, Xarelto, Lovenox, Pradaxa, Brilinta, or Effient? no Aspirin? no  Patient confirms/reports the following medications:  Current Outpatient Prescriptions  Medication Sig Dispense Refill  . DULoxetine (CYMBALTA) 30 MG capsule Take 3 capsules (90 mg total) by mouth daily. 90 capsule 2  . linaclotide (LINZESS) 290 MCG CAPS capsule Take 1 capsule (290 mcg total) by mouth daily before breakfast. 30 capsule 1   No current facility-administered medications for this visit.     Patient confirms/reports the following allergies:  Allergies  Allergen Reactions  . Codeine   . Meperidine   . Oxycodone Hcl     No orders of the defined types were placed in this encounter.   AUTHORIZATION INFORMATION Primary Insurance: 1D#: Group #:  Secondary Insurance: 1D#: Group #:  SCHEDULE INFORMATION: Date: 08/31/16 Time: Location: [place]

## 2016-08-08 NOTE — Telephone Encounter (Signed)
Screening colonoscopy at [facility] on 2 /2/18 with Wohl. Please precert

## 2016-08-10 ENCOUNTER — Ambulatory Visit: Payer: Managed Care, Other (non HMO) | Admitting: Internal Medicine

## 2016-08-17 ENCOUNTER — Ambulatory Visit: Payer: Managed Care, Other (non HMO) | Admitting: Internal Medicine

## 2016-08-20 ENCOUNTER — Encounter: Payer: Self-pay | Admitting: Internal Medicine

## 2016-08-20 ENCOUNTER — Ambulatory Visit (INDEPENDENT_AMBULATORY_CARE_PROVIDER_SITE_OTHER): Payer: Managed Care, Other (non HMO) | Admitting: Internal Medicine

## 2016-08-20 VITALS — BP 146/92 | HR 90 | Ht 62.0 in | Wt 198.0 lb

## 2016-08-20 DIAGNOSIS — M5090 Cervical disc disorder, unspecified, unspecified cervical region: Secondary | ICD-10-CM | POA: Insufficient documentation

## 2016-08-20 DIAGNOSIS — K5901 Slow transit constipation: Secondary | ICD-10-CM | POA: Diagnosis not present

## 2016-08-20 DIAGNOSIS — J111 Influenza due to unidentified influenza virus with other respiratory manifestations: Secondary | ICD-10-CM | POA: Diagnosis not present

## 2016-08-20 MED ORDER — PREDNISONE 10 MG PO TABS: ORAL_TABLET | ORAL | 0 refills | Status: DC

## 2016-08-20 NOTE — Progress Notes (Signed)
Date:  08/20/2016   Name:  Cheryl Figueroa   DOB:  07/04/1961   MRN:  [id number]   Chief Complaint: Cough (pt stated fever, cough, sore throat, body ache) Cough  This is a new problem. The current episode started in the past 7 days. The problem has been gradually worsening. The problem occurs every few hours. The cough is non-productive. Associated symptoms include chills, a fever, a sore throat and sweats. Pertinent negatives include no chest pain or wheezing. She has tried OTC cough suppressant for the symptoms. The treatment provided mild relief.  Constipation  This is a chronic problem. The problem has been gradually improving since onset. Her stool frequency is 1 time per week or less (but has diarrhea if she takes linzess daily). Associated symptoms include a fever. Pertinent negatives include no vomiting.  Neck Pain   This is a recurrent problem. The problem occurs daily. The problem has been gradually worsening. The pain is present in the midline. The quality of the pain is described as burning. Associated symptoms include a fever and numbness (in middle fingers both hands). Pertinent negatives include no chest pain.    Review of Systems  Constitutional: Positive for chills and fever.  HENT: Positive for congestion and sore throat.   Respiratory: Positive for cough. Negative for chest tightness and wheezing.   Cardiovascular: Negative for chest pain, palpitations and leg swelling.  Gastrointestinal: Positive for abdominal distention and constipation. Negative for blood in stool and vomiting.  Musculoskeletal: Positive for neck pain.  Neurological: Positive for numbness (in middle fingers both hands).    Patient Active Problem List   Diagnosis Date Noted  . Slow transit constipation 06/27/2016  . Mood disorder ([place]) 06/27/2016  . Fibromyalgia 11/29/2015  . Atypical chest pain 06/18/2015  . Pain in the chest 02/01/2015  . SOB (shortness of breath) 02/01/2015  . Family  history of early CAD 02/01/2015  . Hyperlipidemia 02/01/2015  . Obesity 02/01/2015    Prior to Admission medications   Medication Sig Start Date End Date Taking? Authorizing Provider  DULoxetine (CYMBALTA) 30 MG capsule Take 3 capsules (90 mg total) by mouth daily. 06/27/16   Glean Hess, MD  linaclotide [facility]) 290 MCG CAPS capsule Take 1 capsule (290 mcg total) by mouth daily before breakfast. 06/27/16   Glean Hess, MD    Allergies  Allergen Reactions  . Codeine   . Meperidine   . Oxycodone Hcl     Past Surgical History:  Procedure Laterality Date  . CESAREAN SECTION    . ruptured disc surgery    . TONSILLECTOMY      Social History  Substance Use Topics  . Smoking status: Never Smoker  . Smokeless tobacco: Never Used  . Alcohol use No     Medication list has been reviewed and updated.   Physical Exam  Constitutional: She is oriented to person, place, and time. She appears well-developed. She has a sickly appearance. No distress.  HENT:  Head: Normocephalic and atraumatic.  Cardiovascular: Normal rate, regular rhythm and normal heart sounds.   Pulmonary/Chest: Effort normal and breath sounds normal. No respiratory distress. She has no wheezes. She has no rhonchi.  Musculoskeletal:       Cervical back: She exhibits decreased range of motion, tenderness and bony tenderness. She exhibits no spasm.  Neurological: She is alert and oriented to person, place, and time.  Skin: Skin is warm and dry. No rash noted.  Psychiatric: She has a  normal mood and affect. Her behavior is normal. Thought content normal.  Nursing note and vitals reviewed.   BP (!) 146/92   Pulse 90   Ht 5\' 2"  (1.575 m)   Wt 198 lb (89.8 kg)   SpO2 96%   BMI 36.21 kg/m   Assessment and Plan: 1. Influenza Continue Delsym, fluids, rest Patient declines Tamiflu  2. Cervical back pain with evidence of disc disease Follow up with Neurosurgery when able - predniSONE (DELTASONE) 10 MG  tablet; Take 6 on day 1, 5 on day 2, 4 on day 3, 3 on day 4, 2 on day 5 and 1 on day 1 then stop.  Dispense: 21 tablet; Refill: 0  3. Slow transit constipation Continue Linzess MWF   Halina Maidens, MD [facility]  08/20/2016

## 2016-08-20 NOTE — Patient Instructions (Signed)
Continue Advil and fluids for fever and pain

## 2016-08-27 ENCOUNTER — Encounter: Payer: Self-pay | Admitting: Anesthesiology

## 2016-08-27 ENCOUNTER — Encounter: Payer: Self-pay | Admitting: *Deleted

## 2016-08-29 ENCOUNTER — Telehealth: Payer: Self-pay | Admitting: Internal Medicine

## 2016-08-29 ENCOUNTER — Other Ambulatory Visit: Payer: Self-pay | Admitting: Internal Medicine

## 2016-08-29 MED ORDER — AZITHROMYCIN 250 MG PO TABS: ORAL_TABLET | ORAL | 0 refills | Status: DC

## 2016-08-29 NOTE — Telephone Encounter (Signed)
Z pak sent to pharmacy

## 2016-08-29 NOTE — Telephone Encounter (Signed)
Pt called stated still having low grade fever 99.6 and coughing up yellow mucus.Marland KitchenMarland Kitchen

## 2016-08-30 ENCOUNTER — Telehealth: Payer: Self-pay | Admitting: Gastroenterology

## 2016-08-30 NOTE — Discharge Instructions (Signed)

## 2016-08-30 NOTE — Telephone Encounter (Signed)
Patient is sick and needs to reschedule. I called [place]

## 2016-08-31 ENCOUNTER — Other Ambulatory Visit: Payer: Self-pay

## 2016-08-31 ENCOUNTER — Ambulatory Visit
Admission: RE | Admit: 2016-08-31 | Payer: Managed Care, Other (non HMO) | Source: Ambulatory Visit | Admitting: Gastroenterology

## 2016-08-31 HISTORY — DX: Dorsopathy, unspecified: M53.9

## 2016-08-31 HISTORY — DX: Reserved for inherently not codable concepts without codable children: IMO0001

## 2016-08-31 HISTORY — DX: Presence of spectacles and contact lenses: Z97.3

## 2016-08-31 HISTORY — DX: Motion sickness, initial encounter: T75.3XXA

## 2016-08-31 HISTORY — DX: Other specified postprocedural states: Z98.890

## 2016-08-31 HISTORY — DX: Family history of other specified conditions: Z84.89

## 2016-08-31 HISTORY — DX: Sleep apnea, unspecified: G47.30

## 2016-08-31 HISTORY — DX: Nausea with vomiting, unspecified: R11.2

## 2016-08-31 HISTORY — DX: Encounter for fitting and adjustment of orthodontic device: Z46.4

## 2016-08-31 SURGERY — COLONOSCOPY WITH PROPOFOL
Anesthesia: Choice

## 2016-08-31 NOTE — Telephone Encounter (Signed)
Pt rescheduled to 09/28/16.

## 2016-09-24 ENCOUNTER — Encounter: Payer: Self-pay | Admitting: Anesthesiology

## 2016-09-24 ENCOUNTER — Encounter: Payer: Self-pay | Admitting: *Deleted

## 2016-09-27 NOTE — Discharge Instructions (Signed)

## 2016-09-28 ENCOUNTER — Ambulatory Visit
Admission: RE | Admit: 2016-09-28 | Payer: Managed Care, Other (non HMO) | Source: Ambulatory Visit | Admitting: Gastroenterology

## 2016-09-28 SURGERY — COLONOSCOPY WITH PROPOFOL
Anesthesia: Choice

## 2016-10-10 ENCOUNTER — Telehealth: Payer: Self-pay | Admitting: Gastroenterology

## 2016-10-10 NOTE — Telephone Encounter (Signed)
Patient had to cancel her colonoscopy earlier and is now ready to reschedule.

## 2016-10-11 ENCOUNTER — Other Ambulatory Visit: Payer: Self-pay

## 2016-10-11 DIAGNOSIS — Z1211 Encounter for screening for malignant neoplasm of colon: Secondary | ICD-10-CM

## 2016-10-11 NOTE — Telephone Encounter (Signed)
Pt scheduled for a screening colonoscopy at [facility] on 11/02/16 with Wohl.

## 2016-10-15 NOTE — Telephone Encounter (Signed)
ded 1300 rem 1209 Max oop 2350 rem 2259 pu 80% npr This would apply only if your insurance company considers this as a diagnostic procedure. Most screening procedures are covered at 100%.

## 2016-10-16 ENCOUNTER — Other Ambulatory Visit: Payer: Self-pay | Admitting: Internal Medicine

## 2016-10-16 ENCOUNTER — Telehealth: Payer: Self-pay

## 2016-10-16 DIAGNOSIS — M797 Fibromyalgia: Secondary | ICD-10-CM

## 2016-10-16 MED ORDER — DULOXETINE HCL 30 MG PO CPEP: 90.0000 mg | ORAL_CAPSULE | Freq: Every day | ORAL | 5 refills | Status: DC

## 2016-10-16 NOTE — Telephone Encounter (Signed)
Pt called requesting refill on Cymbalta 30 mg. Also, pt complaining of dizzy spells for past 3 days, and gets so bad she feels nausea and need to vomit. OV need?

## 2016-10-16 NOTE — Telephone Encounter (Signed)
Yes, OV if dramamine over the counter does not help after 24 hours.

## 2016-10-16 NOTE — Telephone Encounter (Signed)
Informed pt .

## 2016-10-29 ENCOUNTER — Encounter: Payer: Self-pay | Admitting: *Deleted

## 2016-10-29 NOTE — Discharge Instructions (Signed)

## 2016-11-02 ENCOUNTER — Ambulatory Visit
Admission: RE | Admit: 2016-11-02 | Discharge: 2016-11-02 | Disposition: A | Discharge status: Home / Self Care | Payer: Managed Care, Other (non HMO) | Source: Ambulatory Visit | Attending: Gastroenterology | Admitting: Gastroenterology

## 2016-11-02 ENCOUNTER — Encounter
Admission: RE | Disposition: A | Discharge status: Home / Self Care | Payer: Self-pay | Source: Ambulatory Visit | Attending: Gastroenterology

## 2016-11-02 ENCOUNTER — Ambulatory Visit: Payer: Managed Care, Other (non HMO) | Admitting: Anesthesiology

## 2016-11-02 DIAGNOSIS — E785 Hyperlipidemia, unspecified: Secondary | ICD-10-CM | POA: Insufficient documentation

## 2016-11-02 DIAGNOSIS — D125 Benign neoplasm of sigmoid colon: Secondary | ICD-10-CM | POA: Diagnosis not present

## 2016-11-02 DIAGNOSIS — E559 Vitamin D deficiency, unspecified: Secondary | ICD-10-CM | POA: Diagnosis not present

## 2016-11-02 DIAGNOSIS — D124 Benign neoplasm of descending colon: Secondary | ICD-10-CM | POA: Diagnosis not present

## 2016-11-02 DIAGNOSIS — G4730 Sleep apnea, unspecified: Secondary | ICD-10-CM | POA: Insufficient documentation

## 2016-11-02 DIAGNOSIS — Z79899 Other long term (current) drug therapy: Secondary | ICD-10-CM | POA: Insufficient documentation

## 2016-11-02 DIAGNOSIS — K641 Second degree hemorrhoids: Secondary | ICD-10-CM | POA: Diagnosis not present

## 2016-11-02 DIAGNOSIS — M797 Fibromyalgia: Secondary | ICD-10-CM | POA: Insufficient documentation

## 2016-11-02 DIAGNOSIS — K635 Polyp of colon: Secondary | ICD-10-CM

## 2016-11-02 DIAGNOSIS — Z1211 Encounter for screening for malignant neoplasm of colon: Secondary | ICD-10-CM | POA: Diagnosis not present

## 2016-11-02 HISTORY — PX: COLONOSCOPY WITH PROPOFOL: SHX5780

## 2016-11-02 HISTORY — PX: POLYPECTOMY: SHX5525

## 2016-11-02 SURGERY — COLONOSCOPY WITH PROPOFOL
Anesthesia: Monitor Anesthesia Care | Wound class: Contaminated

## 2016-11-02 MED ORDER — LIDOCAINE HCL (CARDIAC) 20 MG/ML IV SOLN
INTRAVENOUS | Status: DC | PRN
  Administered 2016-11-02: 40 mg via INTRAVENOUS

## 2016-11-02 MED ORDER — PROPOFOL 10 MG/ML IV BOLUS
INTRAVENOUS | Status: DC | PRN
  Administered 2016-11-02 (×2): 50 mg via INTRAVENOUS
  Administered 2016-11-02: 100 mg via INTRAVENOUS
  Administered 2016-11-02 (×2): 50 mg via INTRAVENOUS

## 2016-11-02 MED ORDER — STERILE WATER FOR IRRIGATION IR SOLN
Status: DC | PRN
  Administered 2016-11-02: 11:00:00

## 2016-11-02 MED ORDER — ACETAMINOPHEN 325 MG PO TABS: 325.0000 mg | ORAL_TABLET | ORAL | Status: DC | PRN

## 2016-11-02 MED ORDER — ACETAMINOPHEN 160 MG/5ML PO SOLN: 325.0000 mg | ORAL | Status: DC | PRN

## 2016-11-02 MED ORDER — LACTATED RINGERS IV SOLN
INTRAVENOUS | Status: DC | PRN
  Administered 2016-11-02: 11:00:00 via INTRAVENOUS

## 2016-11-02 SURGICAL SUPPLY — 23 items
CANISTER SUCT 1200ML W/VALVE (MISCELLANEOUS) ×3 IMPLANT
CLIP HMST 235XBRD CATH ROT (MISCELLANEOUS) IMPLANT
CLIP RESOLUTION 360 11X235 (MISCELLANEOUS)
FCP ESCP3.2XJMB 240X2.8X (MISCELLANEOUS)
FORCEPS BIOP RAD 4 LRG CAP 4 (CUTTING FORCEPS) IMPLANT
FORCEPS BIOP RJ4 240 W/NDL (MISCELLANEOUS)
FORCEPS ESCP3.2XJMB 240X2.8X (MISCELLANEOUS) IMPLANT
GOWN CVR UNV OPN BCK APRN NK (MISCELLANEOUS) ×4 IMPLANT
GOWN ISOL THUMB LOOP REG UNIV (MISCELLANEOUS) ×6
INJECTOR VARIJECT VIN23 (MISCELLANEOUS) IMPLANT
KIT DEFENDO VALVE AND CONN (KITS) IMPLANT
KIT ENDO PROCEDURE OLY (KITS) ×3 IMPLANT
MARKER SPOT ENDO TATTOO 5ML (MISCELLANEOUS) IMPLANT
PAD GROUND ADULT SPLIT (MISCELLANEOUS) IMPLANT
PROBE APC STR FIRE (PROBE) IMPLANT
RETRIEVER NET ROTH 2.5X230 LF (MISCELLANEOUS) IMPLANT
SNARE SHORT THROW 13M SML OVAL (MISCELLANEOUS) ×1 IMPLANT
SNARE SHORT THROW 30M LRG OVAL (MISCELLANEOUS) IMPLANT
SNARE SNG USE RND 15MM (INSTRUMENTS) IMPLANT
SPOT EX ENDOSCOPIC TATTOO (MISCELLANEOUS)
TRAP ETRAP POLY (MISCELLANEOUS) ×1 IMPLANT
VARIJECT INJECTOR VIN23 (MISCELLANEOUS)
WATER STERILE IRR 250ML POUR (IV SOLUTION) ×3 IMPLANT

## 2016-11-02 NOTE — Anesthesia Procedure Notes (Signed)
Procedure Name: MAC Date/Time: 11/02/2016 10:48 AM Performed by: Janna Arch Pre-anesthesia Checklist: Patient identified, Emergency Drugs available, Suction available and Patient being monitored Patient Re-evaluated:Patient Re-evaluated prior to inductionOxygen Delivery Method: Nasal cannula

## 2016-11-02 NOTE — Op Note (Signed)
[facility] Gastroenterology Patient Name: Cheryl Figueroa Procedure Date: 11/02/2016 10:42 AM MRN: [id number] Account #: [id number] Date of Birth: 05/15/1961 Admit Type: Outpatient Age: 56 Room: [facility] OR ROOM 01 Gender: Female Note Status: Finalized Procedure:            Colonoscopy Indications:          Screening for colorectal malignant neoplasm Providers:            Lucilla Lame MD, MD Referring MD:         Halina Maidens, MD (Referring MD) Medicines:            Propofol per Anesthesia Complications:        No immediate complications. Procedure:            Pre-Anesthesia Assessment:                       - Prior to the procedure, a History and Physical was                        performed, and patient medications and allergies were                        reviewed. The patient's tolerance of previous                        anesthesia was also reviewed. The risks and benefits of                        the procedure and the sedation options and risks were                        discussed with the patient. All questions were                        answered, and informed consent was obtained. Prior                        Anticoagulants: The patient has taken no previous                        anticoagulant or antiplatelet agents. ASA Grade                        Assessment: II - A patient with mild systemic disease.                        After reviewing the risks and benefits, the patient was                        deemed in satisfactory condition to undergo the                        procedure.                       After obtaining informed consent, the colonoscope was                        passed under direct vision. Throughout the procedure,  the patient's blood pressure, pulse, and oxygen                        saturations were monitored continuously. The Olympus                        Colonoscope 190 [id number]) was introduced through the                         anus and advanced to the the cecum, identified by                        appendiceal orifice and ileocecal valve. The                        colonoscopy was performed without difficulty. The                        patient tolerated the procedure well. The quality of                        the bowel preparation was excellent. Findings:      The perianal and digital rectal examinations were normal.      A 4 mm polyp was found in the descending colon. The polyp was sessile.       The polyp was removed with a cold snare. Resection was complete, but the       polyp tissue was not retrieved.      A 9 mm polyp was found in the sigmoid colon. The polyp was pedunculated.       The polyp was removed with a cold snare. Resection and retrieval were       complete.      A 3 mm polyp was found in the sigmoid colon. The polyp was sessile. The       polyp was removed with a cold snare. Resection and retrieval were       complete.      Non-bleeding internal hemorrhoids were found during retroflexion. The       hemorrhoids were Grade II (internal hemorrhoids that prolapse but reduce       spontaneously). Impression:           - One 4 mm polyp in the descending colon, removed with                        a cold snare. Complete resection. Polyp tissue not                        retrieved.                       - One 9 mm polyp in the sigmoid colon, removed with a                        cold snare. Resected and retrieved.                       - One 3 mm polyp in the sigmoid colon, removed with a                        cold  snare. Resected and retrieved.                       - Non-bleeding internal hemorrhoids. Recommendation:       - Discharge patient to home.                       - Resume previous diet.                       - Continue present medications.                       - Await pathology results.                       - Repeat colonoscopy in 5 years if polyp adenoma and  10                        years if hyperplastic Procedure Code(s):    --- Professional ---                       [phone], Colonoscopy, flexible; with removal of tumor(s),                        polyp(s), or other lesion(s) by snare technique Diagnosis Code(s):    --- Professional ---                       Z12.11, Encounter for screening for malignant neoplasm                        of colon                       D12.4, Benign neoplasm of descending colon                       D12.5, Benign neoplasm of sigmoid colon CPT copyright 2016 American Medical Association. All rights reserved. The codes documented in this report are preliminary and upon coder review may  be revised to meet current compliance requirements. Lucilla Lame MD, MD 11/02/2016 11:14:33 AM This report has been signed electronically. Number of Addenda: 0 Note Initiated On: 11/02/2016 10:42 AM Scope Withdrawal Time: 0 hours 14 minutes 10 seconds  Total Procedure Duration: 0 hours 16 minutes 36 seconds       [facility]

## 2016-11-02 NOTE — Anesthesia Preprocedure Evaluation (Addendum)
Anesthesia Evaluation  Patient identified by MRN, date of birth, ID band Patient awake    Reviewed: Allergy & Precautions, H&P , NPO status , Patient's Chart, lab work & pertinent test results, reviewed documented beta blocker date and time   History of Anesthesia Complications (+) PONV, Family history of anesthesia reaction and history of anesthetic complications  Airway Mallampati: II  TM Distance: >3 FB Neck ROM: full   Comment: braces Dental no notable dental hx.    Pulmonary sleep apnea ,    Pulmonary exam normal breath sounds clear to auscultation       Cardiovascular Exercise Tolerance: Good negative cardio ROS   Rhythm:regular Rate:Normal     Neuro/Psych PSYCHIATRIC DISORDERS negative neurological ROS     GI/Hepatic negative GI ROS, Neg liver ROS,   Endo/Other  negative endocrine ROS  Renal/GU negative Renal ROS  negative genitourinary   Musculoskeletal   Abdominal   Peds  Hematology negative hematology ROS (+)   Anesthesia Other Findings   Reproductive/Obstetrics negative OB ROS                            Anesthesia Physical Anesthesia Plan  ASA: II  Anesthesia Plan: MAC   Post-op Pain Management:    Induction:   Airway Management Planned:   Additional Equipment:   Intra-op Plan:   Post-operative Plan:   Informed Consent: I have reviewed the patients History and Physical, chart, labs and discussed the procedure including the risks, benefits and alternatives for the proposed anesthesia with the patient or authorized representative who has indicated his/her understanding and acceptance.     Plan Discussed with: CRNA  Anesthesia Plan Comments:         Anesthesia Quick Evaluation

## 2016-11-02 NOTE — H&P (Signed)
  Lucilla Lame, MD [facility] [address] [place], [place] [address] Phone: [phone] Fax : [phone]  Primary Care Physician:  Halina Maidens, MD Primary Gastroenterologist:  Dr. Allen Norris  Pre-Procedure History & Physical: HPI:  Cheryl Figueroa is a 56 y.o. female is here for a screening colonoscopy.   Past Medical History:  Diagnosis Date  . Family history of adverse reaction to anesthesia    Mother - PONV  . Fibromyalgia   . Hyperlipidemia   . Intermittent palpitations   . Menopause   . Minor depression   . Motion sickness    back seat cars  . Multilevel degenerative disc disease   . Orthodontics    upper braces, lower permanent retainer  . PONV (postoperative nausea and vomiting)   . Sleep apnea    in past - no CPAP  . Vitamin D deficiency   . Wears contact lenses     Past Surgical History:  Procedure Laterality Date  . ANTERIOR CERVICAL DECOMP/DISCECTOMY FUSION  2005, 2009  . CESAREAN SECTION    . TONSILLECTOMY      Prior to Admission medications   Medication Sig Start Date End Date Taking? Authorizing Provider  DULoxetine (CYMBALTA) 30 MG capsule Take 3 capsules (90 mg total) by mouth daily. 10/16/16  Yes Glean Hess, MD  linaclotide [facility]) 290 MCG CAPS capsule Take 1 capsule (290 mcg total) by mouth daily before breakfast. Patient not taking: Reported on 10/29/2016 06/27/16   Glean Hess, MD    Allergies as of 10/11/2016 - Review Complete 09/24/2016  Allergen Reaction Noted  . Codeine Nausea And Vomiting 02/01/2015  . Meperidine Nausea And Vomiting 06/18/2015  . Oxycodone hcl Nausea And Vomiting 06/18/2015    Family History  Problem Relation Age of Onset  . Hypertension Mother   . Diabetes Father     Social History   Social History  . Marital status: Married    Spouse name: N/A  . Number of children: N/A  . Years of education: N/A   Occupational History  . Not on file.   Social History Main Topics  . Smoking status:  Never Smoker  . Smokeless tobacco: Never Used  . Alcohol use No  . Drug use: No  . Sexual activity: Not on file   Other Topics Concern  . Not on file   Social History Narrative  . No narrative on file    Review of Systems: See HPI, otherwise negative ROS  Physical Exam: Ht 5\' 2"  (1.575 m)   Wt 198 lb (89.8 kg)   BMI 36.21 kg/m  General:   Alert,  pleasant and cooperative in NAD Head:  Normocephalic and atraumatic. Neck:  Supple; no masses or thyromegaly. Lungs:  Clear throughout to auscultation.    Heart:  Regular rate and rhythm. Abdomen:  Soft, nontender and nondistended. Normal bowel sounds, without guarding, and without rebound.   Neurologic:  Alert and  oriented x4;  grossly normal neurologically.  Impression/Plan: Cheryl Figueroa is now here to undergo a screening colonoscopy.  Risks, benefits, and alternatives regarding colonoscopy have been reviewed with the patient.  Questions have been answered.  All parties agreeable.

## 2016-11-02 NOTE — Transfer of Care (Signed)
Immediate Anesthesia Transfer of Care Note  Patient: Cheryl Figueroa  Procedure(s) Performed: Procedure(s): COLONOSCOPY WITH PROPOFOL (N/A) POLYPECTOMY  Patient Location: PACU  Anesthesia Type: MAC  Level of Consciousness: awake, alert  and patient cooperative  Airway and Oxygen Therapy: Patient Spontanous Breathing and Patient connected to supplemental oxygen  Post-op Assessment: Post-op Vital signs reviewed, Patient's Cardiovascular Status Stable, Respiratory Function Stable, Patent Airway and No signs of Nausea or vomiting  Post-op Vital Signs: Reviewed and stable  Complications: No apparent anesthesia complications

## 2016-11-02 NOTE — Anesthesia Postprocedure Evaluation (Signed)
Anesthesia Post Note  Patient: Cheryl Figueroa  Procedure(s) Performed: Procedure(s) (LRB): COLONOSCOPY WITH PROPOFOL (N/A) POLYPECTOMY  Patient location during evaluation: PACU Anesthesia Type: MAC Level of consciousness: awake and alert Pain management: pain level controlled Vital Signs Assessment: post-procedure vital signs reviewed and stable Respiratory status: spontaneous breathing, nonlabored ventilation and respiratory function stable Cardiovascular status: stable and blood pressure returned to baseline Anesthetic complications: no    Dravyn Severs D Caius Silbernagel

## 2016-11-05 ENCOUNTER — Encounter: Payer: Self-pay | Admitting: Gastroenterology

## 2016-11-06 ENCOUNTER — Encounter: Payer: Self-pay | Admitting: Gastroenterology

## 2016-11-09 ENCOUNTER — Emergency Department: Payer: Managed Care, Other (non HMO)

## 2016-11-09 ENCOUNTER — Emergency Department
Admission: EM | Admit: 2016-11-09 | Discharge: 2016-11-09 | Disposition: A | Discharge status: Home / Self Care | Payer: Managed Care, Other (non HMO) | Attending: Emergency Medicine | Admitting: Emergency Medicine

## 2016-11-09 ENCOUNTER — Encounter: Payer: Self-pay | Admitting: Emergency Medicine

## 2016-11-09 ENCOUNTER — Encounter: Payer: Self-pay | Admitting: Internal Medicine

## 2016-11-09 ENCOUNTER — Ambulatory Visit (INDEPENDENT_AMBULATORY_CARE_PROVIDER_SITE_OTHER): Payer: Managed Care, Other (non HMO) | Admitting: Internal Medicine

## 2016-11-09 VITALS — BP 140/90 | HR 60 | Temp 99.2°F | Resp 14 | Ht 62.0 in | Wt 201.0 lb

## 2016-11-09 DIAGNOSIS — R109 Unspecified abdominal pain: Secondary | ICD-10-CM | POA: Diagnosis present

## 2016-11-09 DIAGNOSIS — R1031 Right lower quadrant pain: Secondary | ICD-10-CM | POA: Diagnosis not present

## 2016-11-09 DIAGNOSIS — D259 Leiomyoma of uterus, unspecified: Secondary | ICD-10-CM | POA: Diagnosis not present

## 2016-11-09 DIAGNOSIS — Z79899 Other long term (current) drug therapy: Secondary | ICD-10-CM | POA: Diagnosis not present

## 2016-11-09 DIAGNOSIS — N83202 Unspecified ovarian cyst, left side: Secondary | ICD-10-CM | POA: Insufficient documentation

## 2016-11-09 DIAGNOSIS — N83209 Unspecified ovarian cyst, unspecified side: Secondary | ICD-10-CM

## 2016-11-09 LAB — URINALYSIS, COMPLETE (UACMP) WITH MICROSCOPIC
Bilirubin Urine: NEGATIVE
Glucose, UA: NEGATIVE mg/dL
Hgb urine dipstick: NEGATIVE
Ketones, ur: NEGATIVE mg/dL
Leukocytes, UA: NEGATIVE
Nitrite: NEGATIVE
Protein, ur: NEGATIVE mg/dL
Specific Gravity, Urine: 1.002 — ABNORMAL LOW (ref 1.005–1.030)
pH: 6 (ref 5.0–8.0)

## 2016-11-09 LAB — CBC
HCT: 37.4 % (ref 35.0–47.0)
Hemoglobin: 12.8 g/dL (ref 12.0–16.0)
MCH: 29 pg (ref 26.0–34.0)
MCHC: 34.2 g/dL (ref 32.0–36.0)
MCV: 84.9 fL (ref 80.0–100.0)
Platelets: 279 10*3/uL (ref 150–440)
RBC: 4.41 MIL/uL (ref 3.80–5.20)
RDW: 15.3 % — ABNORMAL HIGH (ref 11.5–14.5)
WBC: 9.1 10*3/uL (ref 3.6–11.0)

## 2016-11-09 LAB — COMPREHENSIVE METABOLIC PANEL
ALBUMIN: 4.3 g/dL (ref 3.5–5.0)
ALT: 38 U/L (ref 14–54)
ANION GAP: 8 (ref 5–15)
AST: 35 U/L (ref 15–41)
Alkaline Phosphatase: 122 U/L (ref 38–126)
BUN: 14 mg/dL (ref 6–20)
CHLORIDE: 103 mmol/L (ref 101–111)
CO2: 26 mmol/L (ref 22–32)
Calcium: 9.9 mg/dL (ref 8.9–10.3)
Creatinine, Ser: 0.78 mg/dL (ref 0.44–1.00)
GFR calc Af Amer: >60 mL/min (ref >60)
GFR calc non Af Amer: >60 mL/min (ref >60)
Glucose, Bld: 121 mg/dL — ABNORMAL HIGH (ref 65–99)
POTASSIUM: 3.9 mmol/L (ref 3.5–5.1)
SODIUM: 137 mmol/L (ref 135–145)
Total Bilirubin: 0.7 mg/dL (ref 0.3–1.2)
Total Protein: 8 g/dL (ref 6.5–8.1)

## 2016-11-09 LAB — LIPASE, BLOOD: LIPASE: 30 U/L (ref 11–51)

## 2016-11-09 LAB — PREGNANCY, URINE: PREG TEST UR: NEGATIVE

## 2016-11-09 MED ORDER — IOPAMIDOL (ISOVUE-300) INJECTION 61%
100.0000 mL | Freq: Once | INTRAVENOUS | Status: AC | PRN
  Administered 2016-11-09: 100 mL via INTRAVENOUS

## 2016-11-09 MED ORDER — IOPAMIDOL (ISOVUE-300) INJECTION 61%
30.0000 mL | Freq: Once | INTRAVENOUS | Status: AC | PRN
  Administered 2016-11-09: 30 mL via ORAL

## 2016-11-09 NOTE — ED Notes (Signed)
Pt resting in bed, husband at bedside. 

## 2016-11-09 NOTE — ED Provider Notes (Signed)
[facility] Emergency Department Provider Note  ____________________________________________   I have reviewed the triage vital signs and the nursing notes.   HISTORY  Chief Complaint Abdominal Pain    HPI Cheryl Figueroa is a 56 y.o. female  who complains of a Donald pain for since January. The generalized discomfort. Had a colonoscopy which showed some polyps on Friday. Had more pain on Tuesday, pain is persistent in the right side of the abdomen. Denies any melena bright red blood per rectum, diarrhea or vomiting. Has had some nausea. Does have fibromyalgia. Was sent in here by her primary care doctor cousins of right-sided abdominal pain. She has had no fever although her temples and the 99 range this morning. Nothing makes the pain better nothing makes the pain worse, as a sharp pain has been there for at least 3 days and off and on in some variety it appears for several months      Past Medical History:  Diagnosis Date  . Family history of adverse reaction to anesthesia    Mother - PONV  . Fibromyalgia   . Hyperlipidemia   . Intermittent palpitations   . Menopause   . Minor depression   . Motion sickness    back seat cars  . Multilevel degenerative disc disease   . Orthodontics    upper braces, lower permanent retainer  . PONV (postoperative nausea and vomiting)   . Sleep apnea    in past - no CPAP  . Vitamin D deficiency   . Wears contact lenses     Patient Active Problem List   Diagnosis Date Noted  . Special screening for malignant neoplasms, colon   . Benign neoplasm of descending colon   . Polyp of sigmoid colon   . Cervical back pain with evidence of disc disease 08/20/2016  . Slow transit constipation 06/27/2016  . Mood disorder ([place]) 06/27/2016  . Fibromyalgia 11/29/2015  . Atypical chest pain 06/18/2015  . Pain in the chest 02/01/2015  . SOB (shortness of breath) 02/01/2015  . Family history of early CAD 02/01/2015  .  Hyperlipidemia 02/01/2015  . Obesity 02/01/2015    Past Surgical History:  Procedure Laterality Date  . ANTERIOR CERVICAL DECOMP/DISCECTOMY FUSION  2005, 2009  . CESAREAN SECTION    . COLONOSCOPY WITH PROPOFOL N/A 11/02/2016   Procedure: COLONOSCOPY WITH PROPOFOL;  Surgeon: Lucilla Lame, MD;  Location: [place];  Service: Endoscopy;  Laterality: N/A;  . POLYPECTOMY  11/02/2016   Procedure: POLYPECTOMY;  Surgeon: Lucilla Lame, MD;  Location: [place];  Service: Endoscopy;;  . TONSILLECTOMY      Prior to Admission medications   Medication Sig Start Date End Date Taking? Authorizing Provider  DULoxetine (CYMBALTA) 30 MG capsule Take 3 capsules (90 mg total) by mouth daily. 10/16/16   Glean Hess, MD  linaclotide [facility]) 290 MCG CAPS capsule Take 1 capsule (290 mcg total) by mouth daily before breakfast. 06/27/16   Glean Hess, MD    Allergies Codeine; Meperidine; and Oxycodone hcl  Family History  Problem Relation Age of Onset  . Hypertension Mother   . Diabetes Father     Social History Social History  Substance Use Topics  . Smoking status: Never Smoker  . Smokeless tobacco: Never Used  . Alcohol use No    Review of Systems Constitutional: No fever/chills Eyes: No visual changes. ENT: No sore throat. No stiff neck no neck pain Cardiovascular: Denies chest pain. Respiratory: Denies shortness  of breath. Gastrointestinal:   no vomiting.  No diarrhea.  No constipation. Genitourinary: Negative for dysuria. Musculoskeletal: Negative lower extremity swelling Skin: Negative for rash. Neurological: Negative for severe headaches, focal weakness or numbness. 10-point ROS otherwise negative.  ____________________________________________   PHYSICAL EXAM:  VITAL SIGNS: ED Triage Vitals [11/09/16 1120]  Enc Vitals Group     BP (!) 151/90     Pulse Rate 84     Resp 18     Temp 99.6 F (37.6 C)     Temp Source Oral     SpO2 97 %     Weight  201 lb (91.2 kg)     Height      Head Circumference      Peak Flow      Pain Score 7     Pain Loc      Pain Edu?      Excl. in [place]?     Constitutional: Alert and oriented. Well appearing and in no acute distress. Eyes: Conjunctivae are normal. PERRL. EOMI. Head: Atraumatic. Nose: No congestion/rhinnorhea. Mouth/Throat: Mucous membranes are moist.  Oropharynx non-erythematous. Neck: No stridor.   Nontender with no meningismus Cardiovascular: Normal rate, regular rhythm. Grossly normal heart sounds.  Good peripheral circulation. Respiratory: Normal respiratory effort.  No retractions. Lungs CTAB. Abdominal: Soft and positive tenderness to palpation in the right lower quadrant. No distention. No guarding no rebound and surgical abdomen Back:  There is no focal tenderness or step off.  there is no midline tenderness there are no lesions noted. there is no CVA tenderness Musculoskeletal: No lower extremity tenderness, no upper extremity tenderness. No joint effusions, no DVT signs strong distal pulses no edema Neurologic:  Normal speech and language. No gross focal neurologic deficits are appreciated.  Skin:  Skin is warm, dry and intact. No rash noted. Psychiatric: Mood and affect are normal. Speech and behavior are normal.  ____________________________________________   LABS (all labs ordered are listed, but only abnormal results are displayed)  Labs Reviewed  COMPREHENSIVE METABOLIC PANEL - Abnormal; Notable for the following:       Result Value   Glucose, Bld 121 (*)    All other components within normal limits  CBC - Abnormal; Notable for the following:    RDW 15.3 (*)    All other components within normal limits  URINALYSIS, COMPLETE (UACMP) WITH MICROSCOPIC - Abnormal; Notable for the following:    Color, Urine STRAW (*)    APPearance CLEAR (*)    Specific Gravity, Urine 1.002 (*)    Bacteria, UA RARE (*)    Squamous Epithelial / LPF 0-5 (*)    All other components within  normal limits  LIPASE, BLOOD   ____________________________________________  EKG  I personally interpreted any EKGs ordered by me or triage  ____________________________________________  RADIOLOGY  I reviewed any imaging ordered by me or triage that were performed during my shift and, if possible, patient and/or family made aware of any abnormal findings. ____________________________________________   PROCEDURES  Procedure(s) performed: None  Procedures  Critical Care performed: None  ____________________________________________   INITIAL IMPRESSION / ASSESSMENT AND PLAN / ED COURSE  Pertinent labs & imaging results that were available during my care of the patient were reviewed by me and considered in my medical decision making (see chart for details).  Administration in no acute distress, has had abdominal pain since January, seems to wax and wane, very poorly described. More focal however last couple days and apparently there is some  concern that this might present appendicitis. Low-grade fever but no white count, low suspicion for acute appendicitis or not possible. Also could have a combination from her colonoscopy since perforation but again very unlikely given her exam. Unfortunately, I do feel the patient likely will require CT scan to further evaluate this and we will do so.  ____________________________________________   FINAL CLINICAL IMPRESSION(S) / ED DIAGNOSES  Final diagnoses:  None      This chart was dictated using voice recognition software.  Despite best efforts to proofread,  errors can occur which can change meaning.      Schuyler Amor, MD 11/09/16 1315

## 2016-11-09 NOTE — ED Notes (Signed)
Pt states RLQ abd pain since January, states she had a colonoscopy last week and found a few polyps but states since the pain in RLQ has gotten worse, states nausea but not vomiting, at present pt awake and alert, last BM 4/12, denies any vaginal discharge but states irregular periods, awake and alert in no acute distress

## 2016-11-09 NOTE — ED Notes (Signed)
Pt finished CT contrast, CT made aware, pt resting in bed with husband at bedside

## 2016-11-09 NOTE — Progress Notes (Signed)
Date:  11/09/2016   Name:  Cheryl Figueroa   DOB:  September 04, 1960   MRN:  [id number]   Chief Complaint: Abdominal Pain Abdominal Pain  This is a new problem. The current episode started in the past 7 days. The onset quality is gradual. The problem has been gradually worsening. The pain is located in the RLQ. The pain is mild. The quality of the pain is colicky, cramping and a sensation of fullness. The abdominal pain does not radiate. Pertinent negatives include no diarrhea, fever, nausea or vomiting.  She had a colonoscopy last week.  After that she began having a constant pain in her RLQ with radiation to her groin.  Her bowels have been normal.  No fever that she is aware of.  No change with eating or drinking.  Over the past few days it is worsening and waking her up at night.    Review of Systems  Constitutional: Positive for diaphoresis. Negative for chills, fatigue and fever.  Respiratory: Negative for chest tightness and shortness of breath.   Cardiovascular: Negative for chest pain and palpitations.  Gastrointestinal: Positive for abdominal pain. Negative for blood in stool, diarrhea, nausea and vomiting.  Skin: Negative for rash.  Psychiatric/Behavioral: Negative for sleep disturbance.    Patient Active Problem List   Diagnosis Date Noted  . Special screening for malignant neoplasms, colon   . Benign neoplasm of descending colon   . Polyp of sigmoid colon   . Cervical back pain with evidence of disc disease 08/20/2016  . Slow transit constipation 06/27/2016  . Mood disorder ([place]) 06/27/2016  . Fibromyalgia 11/29/2015  . Atypical chest pain 06/18/2015  . Pain in the chest 02/01/2015  . SOB (shortness of breath) 02/01/2015  . Family history of early CAD 02/01/2015  . Hyperlipidemia 02/01/2015  . Obesity 02/01/2015    Prior to Admission medications   Medication Sig Start Date End Date Taking? Authorizing Provider  DULoxetine (CYMBALTA) 30 MG capsule Take 3 capsules (90  mg total) by mouth daily. 10/16/16  Yes Glean Hess, MD  linaclotide [facility]) 290 MCG CAPS capsule Take 1 capsule (290 mcg total) by mouth daily before breakfast. 06/27/16  Yes Glean Hess, MD    Allergies  Allergen Reactions  . Codeine Nausea And Vomiting  . Meperidine Nausea And Vomiting  . Oxycodone Hcl Nausea And Vomiting    Past Surgical History:  Procedure Laterality Date  . ANTERIOR CERVICAL DECOMP/DISCECTOMY FUSION  2005, 2009  . CESAREAN SECTION    . COLONOSCOPY WITH PROPOFOL N/A 11/02/2016   Procedure: COLONOSCOPY WITH PROPOFOL;  Surgeon: Lucilla Lame, MD;  Location: [place];  Service: Endoscopy;  Laterality: N/A;  . POLYPECTOMY  11/02/2016   Procedure: POLYPECTOMY;  Surgeon: Lucilla Lame, MD;  Location: [place];  Service: Endoscopy;;  . TONSILLECTOMY      Social History  Substance Use Topics  . Smoking status: Never Smoker  . Smokeless tobacco: Never Used  . Alcohol use No     Medication list has been reviewed and updated.   Physical Exam  Constitutional: She is oriented to person, place, and time. She appears well-developed. No distress.  HENT:  Head: Normocephalic and atraumatic.  Neck: Normal range of motion.  Cardiovascular: Normal rate, regular rhythm and normal heart sounds.   Pulmonary/Chest: Effort normal and breath sounds normal. No respiratory distress. She has no wheezes.  Abdominal: Normal appearance. Bowel sounds are decreased. There is tenderness in the right lower quadrant.  There is guarding. There is no rigidity, no rebound and no CVA tenderness.  Musculoskeletal: Normal range of motion.  Neurological: She is alert and oriented to person, place, and time.  Skin: Skin is warm. No rash noted. She is diaphoretic.  Psychiatric: She has a normal mood and affect. Her speech is normal and behavior is normal. Thought content normal.  Nursing note and vitals reviewed.   BP 140/90   Pulse 60   Temp 99.2 F (37.3 C)    Resp 14   Ht 5\' 2"  (1.575 m)   Wt 201 lb (91.2 kg)   BMI 36.76 kg/m   Assessment and Plan: 1. RLQ abdominal pain Concern for occult appendicitis Pt is sent to ER for further evaluation   No orders of the defined types were placed in this encounter.   Halina Maidens, MD [facility]  11/09/2016

## 2016-11-09 NOTE — ED Notes (Signed)
FIRST  NURSE: Dr Jonna Munro sent pt over for further eval of abd pain, nausea and fever. NAD.

## 2016-11-09 NOTE — ED Triage Notes (Addendum)
Pt to ed with c/o abd pain since early January.  Pt states started with a fever, nausea about 1 week ago, intermittently.

## 2016-11-13 ENCOUNTER — Other Ambulatory Visit: Payer: Self-pay

## 2017-02-11 ENCOUNTER — Encounter: Payer: Self-pay | Admitting: Internal Medicine

## 2017-02-11 ENCOUNTER — Other Ambulatory Visit: Payer: Self-pay | Admitting: Internal Medicine

## 2017-02-11 ENCOUNTER — Ambulatory Visit (INDEPENDENT_AMBULATORY_CARE_PROVIDER_SITE_OTHER): Payer: Managed Care, Other (non HMO) | Admitting: Internal Medicine

## 2017-02-11 VITALS — BP 142/88 | HR 98 | Ht 62.0 in | Wt 201.0 lb

## 2017-02-11 DIAGNOSIS — R739 Hyperglycemia, unspecified: Secondary | ICD-10-CM

## 2017-02-11 DIAGNOSIS — F419 Anxiety disorder, unspecified: Secondary | ICD-10-CM | POA: Insufficient documentation

## 2017-02-11 DIAGNOSIS — R5383 Other fatigue: Secondary | ICD-10-CM | POA: Insufficient documentation

## 2017-02-11 DIAGNOSIS — M2550 Pain in unspecified joint: Secondary | ICD-10-CM | POA: Insufficient documentation

## 2017-02-11 DIAGNOSIS — G4733 Obstructive sleep apnea (adult) (pediatric): Secondary | ICD-10-CM

## 2017-02-11 DIAGNOSIS — Z8349 Family history of other endocrine, nutritional and metabolic diseases: Secondary | ICD-10-CM

## 2017-02-11 DIAGNOSIS — M797 Fibromyalgia: Secondary | ICD-10-CM

## 2017-02-11 DIAGNOSIS — R002 Palpitations: Secondary | ICD-10-CM | POA: Insufficient documentation

## 2017-02-11 NOTE — Progress Notes (Signed)
Date:  02/11/2017   Name:  Cheryl Figueroa   DOB:  1960-12-30   MRN:  [id number]   Chief Complaint: Fatigue (Started walking Monday for exercise. Tuesday afternoon felt extremely tired- "it hurt to breathe". Been tired ever since. Took a shower today and then felt like thats all she could do after. Feeling foggy headed- can't concentrate or rememeber things. ); Foot Pain (Anything that touches feet causes pain- feels like numbness and tingling pain. It gets aggarvated. Has to moved feet to where nothing is touching it. Wearing shoes its fine. Laying under the blanket causes this pain. ); and Palpitations (Can be sitting still and starts having palpitations. )  Foot Pain  This is a new problem. The problem occurs constantly. The problem has been unchanged. Associated symptoms include abdominal pain, arthralgias, fatigue, headaches, joint swelling and myalgias. Pertinent negatives include no chest pain, chills, diaphoresis, fever, nausea, rash, vomiting or weakness. Exacerbated by: pressure from sheets in bed or panty hose. She has tried nothing for the symptoms.  Palpitations   This is a new problem. The problem occurs daily. The problem has been unchanged. On average, each episode lasts 5 seconds. The symptoms are aggravated by diet pills. Associated symptoms include anxiety. Pertinent negatives include no chest pain, diaphoresis, dizziness, fever, irregular heartbeat, nausea, shortness of breath, vomiting or weakness. She has tried nothing for the symptoms.  Fatigue - sleeping all week and not feeling rested.  Tried to exercise Monday and Tuesday and then was wiped out the rest of the week.  She had been told she had sleep apnea many years ago but could not tolerate CPAP. Abdominal pain - has had persistent RUQ pain.  CT abdomen was normal earlier this year.  Her 60 yo son recently diagnosed with hemachromatosis.  She wants to be tested. Arthralgia - has swelling of ankles and knuckles on both  hands,  Stiff and tender to touch.  No recent injury. Had been taking Advil but ran out; taking tylenol with less benefit. Review of Systems  Constitutional: Positive for fatigue. Negative for chills, diaphoresis, fever and unexpected weight change.  Eyes: Negative for visual disturbance.  Respiratory: Negative for chest tightness, shortness of breath and wheezing.   Cardiovascular: Positive for palpitations and leg swelling. Negative for chest pain.  Gastrointestinal: Positive for abdominal pain. Negative for blood in stool, nausea and vomiting.  Musculoskeletal: Positive for arthralgias, joint swelling and myalgias.  Skin: Negative for rash.  Neurological: Positive for headaches. Negative for dizziness, tremors, syncope and weakness.  Psychiatric/Behavioral: The patient is nervous/anxious.     Patient Active Problem List   Diagnosis Date Noted  . Anxiety 02/11/2017  . Obesity with body mass index 30 or greater 02/11/2017  . Palpitations 02/11/2017  . FHx: hemochromatosis 02/11/2017  . Arthralgia 02/11/2017  . Fatigue 02/11/2017  . OSA (obstructive sleep apnea) 02/11/2017  . Special screening for malignant neoplasms, colon   . Benign neoplasm of descending colon   . Polyp of sigmoid colon   . Cervical back pain with evidence of disc disease 08/20/2016  . Slow transit constipation 06/27/2016  . Mood disorder ([place]) 06/27/2016  . Fibromyalgia 11/29/2015  . Atypical chest pain 06/18/2015  . Pain in the chest 02/01/2015  . SOB (shortness of breath) 02/01/2015  . Family history of early CAD 02/01/2015  . Hyperlipidemia 02/01/2015  . Obesity 02/01/2015    Prior to Admission medications   Medication Sig Start Date End Date Taking? Authorizing Provider  DULoxetine (CYMBALTA) 30 MG capsule Take 3 capsules (90 mg total) by mouth daily. 10/16/16  Yes Glean Hess, MD  linaclotide [facility]) 290 MCG CAPS capsule Take 1 capsule (290 mcg total) by mouth daily before breakfast. 06/27/16   Yes Glean Hess, MD  phentermine (ADIPEX-P) 37.5 MG tablet phentermine 37.5 mg tablet  Take 1 tablet every day by oral route in the morning.   Yes [provider]    Allergies  Allergen Reactions  . Codeine Nausea And Vomiting  . Meperidine Nausea And Vomiting  . Oxycodone Hcl Nausea And Vomiting    Past Surgical History:  Procedure Laterality Date  . ANTERIOR CERVICAL DECOMP/DISCECTOMY FUSION  2005, 2009  . CESAREAN SECTION    . COLONOSCOPY WITH PROPOFOL N/A 11/02/2016   Procedure: COLONOSCOPY WITH PROPOFOL;  Surgeon: Lucilla Lame, MD;  Location: [place];  Service: Endoscopy;  Laterality: N/A;  . POLYPECTOMY  11/02/2016   Procedure: POLYPECTOMY;  Surgeon: Lucilla Lame, MD;  Location: [place];  Service: Endoscopy;;  . TONSILLECTOMY      Social History  Substance Use Topics  . Smoking status: Never Smoker  . Smokeless tobacco: Never Used  . Alcohol use No     Medication list has been reviewed and updated.   Physical Exam  Constitutional: She is oriented to person, place, and time. She appears well-developed and well-nourished. No distress.  HENT:  Head: Normocephalic and atraumatic.  Neck: Normal range of motion. Neck supple. No thyromegaly present.  Cardiovascular: Normal rate, regular rhythm, normal heart sounds and normal pulses.   No murmur heard. Pulmonary/Chest: Effort normal and breath sounds normal. No respiratory distress. She has no wheezes.  Abdominal: Soft. There is tenderness in the right upper quadrant. There is no rigidity, no guarding and no CVA tenderness.  Musculoskeletal: Normal range of motion.  Diffuse myalgia to palpation bilaterally  Synovitis of MCP middle fingers both hands  Tenderness and swelling both lateral ankles and tender also later calcaneous bilaterally  Neurological: She is alert and oriented to person, place, and time.  Skin: Skin is warm and dry. No rash noted.  Psychiatric: Her behavior is normal.  Thought content normal. Her mood appears anxious. Her affect is not inappropriate. She does not exhibit a depressed mood.  Nursing note and vitals reviewed.   BP (!) 142/88   Pulse 98   Ht 5\' 2"  (1.575 m)   Wt 201 lb (91.2 kg)   LMP 10/26/2016 (Exact Date)   SpO2 96%   BMI 36.76 kg/m   Assessment and Plan: 1. Fibromyalgia Begin Lyrica - 50 mg daily x 7 d the bid  2. Palpitations EKG normal; check CMP - EKG 12-Lead - NSR @ 78 - Comprehensive metabolic panel  3. FHx: hemochromatosis Recent CT abdomen showed normal liver - Fe+TIBC+Fer  4. Arthralgia, unspecified joint Concern for rheumatologic disease - Rheumatoid factor - Sedimentation rate - ANA w/Reflex if Positive  5. Fatigue, unspecified type Likely fibromyalgia + untreated OSA - Thyroid Panel With TSH - Vitamin B12  6. OSA (obstructive sleep apnea) - Ambulatory referral to Sleep Studies  7. Hyperglycemia - Hemoglobin A1c   No orders of the defined types were placed in this encounter.   Halina Maidens, MD [facility]  02/11/2017

## 2017-02-11 NOTE — Patient Instructions (Addendum)
Lyrica 50 mg - take one at bedtime for one week then one twice a day.  Continue Cymbalta daily.

## 2017-02-12 LAB — HEMOGLOBIN A1C
Est. average glucose Bld gHb Est-mCnc: 120 mg/dL
Hgb A1c MFr Bld: 5.8 % — ABNORMAL HIGH (ref 4.8–5.6)

## 2017-02-12 LAB — ANA W/REFLEX IF POSITIVE: Anti Nuclear Antibody(ANA): NEGATIVE

## 2017-02-12 LAB — IRON,TIBC AND FERRITIN PANEL
FERRITIN: 29 ng/mL (ref 15–150)
Iron Saturation: 16 % (ref 15–55)
Iron: 60 ug/dL (ref 27–159)
TIBC: 376 ug/dL (ref 250–450)
UIBC: 316 ug/dL (ref 131–425)

## 2017-02-12 LAB — COMPREHENSIVE METABOLIC PANEL
ALBUMIN: 4.8 g/dL (ref 3.5–5.5)
ALT: 28 IU/L (ref 0–32)
AST: 27 IU/L (ref 0–40)
Albumin/Globulin Ratio: 1.7 (ref 1.2–2.2)
Alkaline Phosphatase: 137 IU/L — ABNORMAL HIGH (ref 39–117)
BUN / CREAT RATIO: 13 (ref 9–23)
BUN: 12 mg/dL (ref 6–24)
Bilirubin Total: 0.5 mg/dL (ref 0.0–1.2)
CO2: 23 mmol/L (ref 20–29)
CREATININE: 0.89 mg/dL (ref 0.57–1.00)
Calcium: 10.5 mg/dL — ABNORMAL HIGH (ref 8.7–10.2)
Chloride: 97 mmol/L (ref 96–106)
GFR, EST AFRICAN AMERICAN: 84 mL/min/{1.73_m2} (ref >59)
GFR, EST NON AFRICAN AMERICAN: 73 mL/min/{1.73_m2} (ref >59)
GLOBULIN, TOTAL: 2.9 g/dL (ref 1.5–4.5)
Glucose: 76 mg/dL (ref 65–99)
Potassium: 4.4 mmol/L (ref 3.5–5.2)
SODIUM: 138 mmol/L (ref 134–144)
TOTAL PROTEIN: 7.7 g/dL (ref 6.0–8.5)

## 2017-02-12 LAB — THYROID PANEL WITH TSH
Free Thyroxine Index: 1.4 (ref 1.2–4.9)
T3 Uptake Ratio: 30 % (ref 24–39)
T4 TOTAL: 4.5 ug/dL (ref 4.5–12.0)
TSH: 5.73 u[IU]/mL — ABNORMAL HIGH (ref 0.450–4.500)

## 2017-02-12 LAB — SEDIMENTATION RATE: SED RATE: 15 mm/h (ref 0–40)

## 2017-02-12 LAB — VITAMIN B12: VITAMIN B 12: 457 pg/mL (ref 232–1245)

## 2017-02-12 LAB — RHEUMATOID FACTOR

## 2017-03-25 ENCOUNTER — Telehealth: Payer: Self-pay

## 2017-03-25 ENCOUNTER — Other Ambulatory Visit: Payer: Self-pay | Admitting: Internal Medicine

## 2017-03-25 MED ORDER — PREGABALIN 50 MG PO CAPS: 50.0000 mg | ORAL_CAPSULE | Freq: Two times a day (BID) | ORAL | 5 refills | Status: DC

## 2017-03-25 NOTE — Telephone Encounter (Signed)
Had samples of Lyrica and would like Rx sent to [facility] in [place].

## 2017-04-15 ENCOUNTER — Encounter: Payer: Self-pay | Admitting: Internal Medicine

## 2017-04-15 ENCOUNTER — Ambulatory Visit (INDEPENDENT_AMBULATORY_CARE_PROVIDER_SITE_OTHER): Payer: Managed Care, Other (non HMO) | Admitting: Internal Medicine

## 2017-04-15 VITALS — BP 148/100 | HR 81 | Ht 62.0 in | Wt 204.0 lb

## 2017-04-15 DIAGNOSIS — G4733 Obstructive sleep apnea (adult) (pediatric): Secondary | ICD-10-CM

## 2017-04-15 DIAGNOSIS — M797 Fibromyalgia: Secondary | ICD-10-CM

## 2017-04-15 DIAGNOSIS — E049 Nontoxic goiter, unspecified: Secondary | ICD-10-CM | POA: Diagnosis not present

## 2017-04-15 DIAGNOSIS — E039 Hypothyroidism, unspecified: Secondary | ICD-10-CM | POA: Insufficient documentation

## 2017-04-15 MED ORDER — GABAPENTIN 100 MG PO CAPS: 100.0000 mg | ORAL_CAPSULE | Freq: Three times a day (TID) | ORAL | 2 refills | Status: DC

## 2017-04-15 NOTE — Progress Notes (Signed)
Date:  04/15/2017   Name:  Cheryl Figueroa   DOB:  1961-02-09   MRN:  [id number]   Chief Complaint: Fibromyalgia (Lyrica was $500 at pharmacy. Worked while using samples. but too expensive at pharmacy. ); Recheck Calcium; and Mass (Front neck knot. Wants checked to make sure nothing. )  HPI Sleep apnea - sleep study showed mild sleep apnea; she had indicated that she would not be willing to try CPAP machine due to a bad experience in the past. However she does want to have her sleep apnea treated and is seeing the sleep specialist tomorrow. Apparently insurance indicated they would not cover a CPAP titration but would cover AutoPap.  Thyroid mass - patient has noticed a fullness in her anterior throat. Is not tender or warm. She denies any trouble swallowing. Of note, thyroid function tests were normal in July.  Fibromyalgia - patient was given Lyrica 50 mg twice a day for fibromyalgia. She had excellent response but was unable to afford the prescription. Even with a savings card it was $300 per month.  Hypercalcemia - on screening labs 2 months ago her calcium level was modestly elevated. She denies muscle cramps or spasms. She is here to have that rechecked.  BP Readings from Last 3 Encounters:  04/15/17 (!) 148/100  02/11/17 (!) 142/88  11/09/16 (!) 114/54    Review of Systems  Constitutional: Positive for fatigue. Negative for chills and fever.  HENT: Negative for trouble swallowing.   Respiratory: Negative for cough, chest tightness, shortness of breath and wheezing.   Gastrointestinal: Negative for abdominal pain.  Musculoskeletal: Positive for myalgias. Negative for arthralgias.  Skin: Positive for wound (fell and abraded left knee). Negative for rash.  Psychiatric/Behavioral: Positive for dysphoric mood and sleep disturbance.    Patient Active Problem List   Diagnosis Date Noted  . Anxiety 02/11/2017  . Obesity with body mass index 30 or greater 02/11/2017  .  Palpitations 02/11/2017  . FHx: hemochromatosis 02/11/2017  . Arthralgia 02/11/2017  . Fatigue 02/11/2017  . OSA (obstructive sleep apnea) 02/11/2017  . Special screening for malignant neoplasms, colon   . Benign neoplasm of descending colon   . Polyp of sigmoid colon   . Cervical back pain with evidence of disc disease 08/20/2016  . Slow transit constipation 06/27/2016  . Mood disorder ([place]) 06/27/2016  . Fibromyalgia 11/29/2015  . Atypical chest pain 06/18/2015  . Pain in the chest 02/01/2015  . SOB (shortness of breath) 02/01/2015  . Family history of early CAD 02/01/2015  . Hyperlipidemia 02/01/2015  . Obesity 02/01/2015    Prior to Admission medications   Medication Sig Start Date End Date Taking? Authorizing Provider  DULoxetine (CYMBALTA) 30 MG capsule Take 3 capsules (90 mg total) by mouth daily. 10/16/16  Yes Glean Hess, MD    Allergies  Allergen Reactions  . Codeine Nausea And Vomiting  . Meperidine Nausea And Vomiting  . Oxycodone Hcl Nausea And Vomiting    Past Surgical History:  Procedure Laterality Date  . ANTERIOR CERVICAL DECOMP/DISCECTOMY FUSION  2005, 2009  . CESAREAN SECTION    . COLONOSCOPY WITH PROPOFOL N/A 11/02/2016   Procedure: COLONOSCOPY WITH PROPOFOL;  Surgeon: Lucilla Lame, MD;  Location: [facility];  Service: Endoscopy;  Laterality: N/A;  . POLYPECTOMY  11/02/2016   Procedure: POLYPECTOMY;  Surgeon: Lucilla Lame, MD;  Location: [facility];  Service: Endoscopy;;  . TONSILLECTOMY      Social History  Substance Use Topics  .  Smoking status: Never Smoker  . Smokeless tobacco: Never Used  . Alcohol use No     Medication list has been reviewed and updated.  No flowsheet data found.  Physical Exam  Constitutional: She is oriented to person, place, and time. She appears well-developed. No distress.  HENT:  Head: Normocephalic and atraumatic.  Neck: Normal range of motion. Neck supple. Thyromegaly (mild anterior thyroid  fullness) present.  Cardiovascular: Normal rate, regular rhythm and normal heart sounds.   Pulmonary/Chest: Effort normal. No respiratory distress. She has no wheezes.  Musculoskeletal: Normal range of motion.  Neurological: She is alert and oriented to person, place, and time.  Skin: Skin is warm and dry. No rash noted.     Sun burn with pink warm skin noted on extensor surfaces of lower legs and on forearms and nose  Psychiatric: She has a normal mood and affect. Her speech is normal and behavior is normal. Thought content normal.  Nursing note and vitals reviewed.   BP (!) 148/100   Pulse 81   Ht 5\' 2"  (1.575 m)   Wt 204 lb (92.5 kg)   SpO2 98%   BMI 37.31 kg/m   Assessment and Plan: 1. OSA (obstructive sleep apnea) See sleep specialist for further discussion tomorrow Proceed with APap if advised  2. Fibromyalgia Begin gabapentin - titrate up as instructed  3. Hypercalcemia Recheck labs before further workup - Calcium  4. Enlargement of thyroid - US Soft Tissue Head/Neck; Future  Lab Results  Component Value Date   TSH 5.730 (H) 02/11/2017   T4TOTAL 4.5 02/11/2017      Meds ordered this encounter  Medications  . gabapentin (NEURONTIN) 100 MG capsule    Sig: Take 1-2 capsules (100-200 mg total) by mouth 3 (three) times daily.    Dispense:  180 capsule    Refill:  2    Partially dictated using Editor, commissioning. Any errors are unintentional.  Halina Maidens, MD [facility]  04/15/2017

## 2017-04-15 NOTE — Patient Instructions (Signed)
One gabapentin (100 mg) twice a day for 3 days then  Increase to one three times a day for 3 days.  If needed, can increase again to 100 mg AM, 100 mg afternoon, and 200 mg at bedtime.  Maximum dose is 200 mg three times a day.

## 2017-04-16 LAB — CALCIUM: Calcium: 9.9 mg/dL (ref 8.7–10.2)

## 2017-04-17 ENCOUNTER — Ambulatory Visit
Admission: RE | Admit: 2017-04-17 | Discharge: 2017-04-17 | Disposition: A | Discharge status: Home / Self Care | Payer: Managed Care, Other (non HMO) | Source: Ambulatory Visit | Attending: Internal Medicine | Admitting: Internal Medicine

## 2017-04-17 DIAGNOSIS — E049 Nontoxic goiter, unspecified: Secondary | ICD-10-CM | POA: Diagnosis present

## 2017-04-17 DIAGNOSIS — E042 Nontoxic multinodular goiter: Secondary | ICD-10-CM | POA: Diagnosis not present

## 2017-04-25 IMAGING — CT CT HEART SCORING
1 of 3 series · 10 of 20 positions shown, 13 images · non-contrast
Comparison: None.

CLINICAL DATA: Risk stratification

EXAM:
Coronary Calcium Score
TECHNIQUE: The patient was scanned on a Siemens Sensation 16 slice scanner.
Axial non-contrast 3mm slices were carried out through the heart.
The data set was analyzed on a dedicated work station and scored
using the Agatson method.

[Series 6: st thins for reformat · axial · 0.68mm/px · z∈[-204,-112]mm · 10 of 114 slices shown, 13 images]
[im 11/114  vessel]
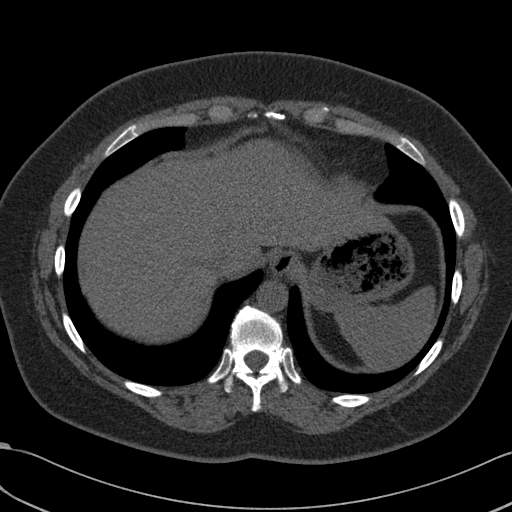
[im 11/114  lung]
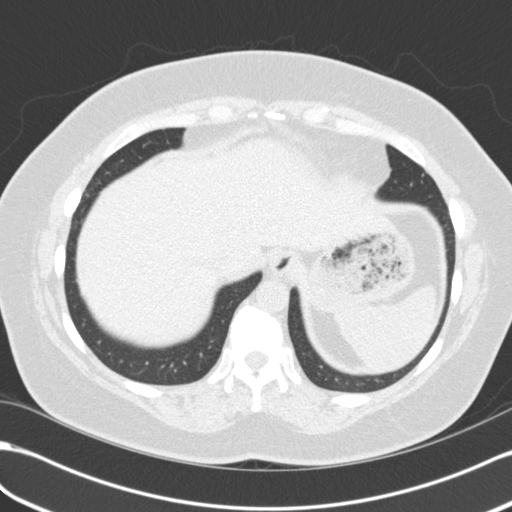
[im 21/114  vessel]
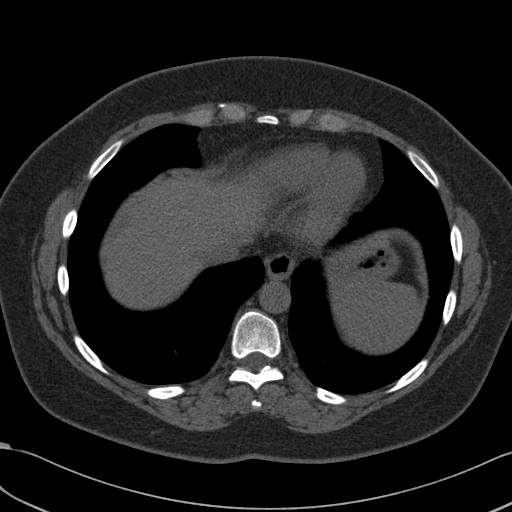
[im 31/114  vessel]
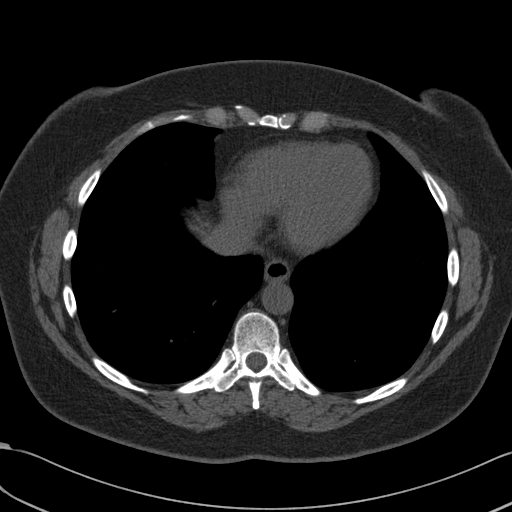
[im 42/114  vessel]
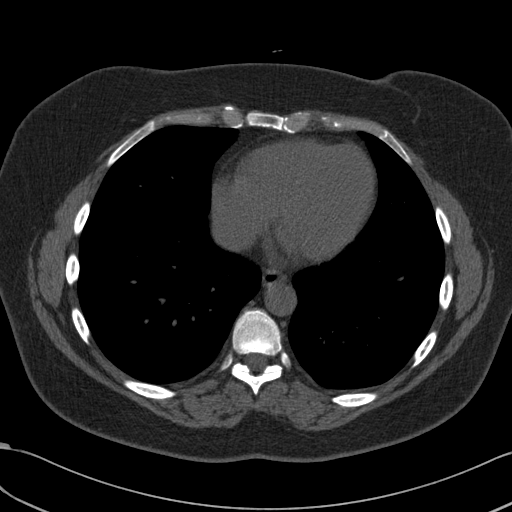
[im 52/114  vessel]
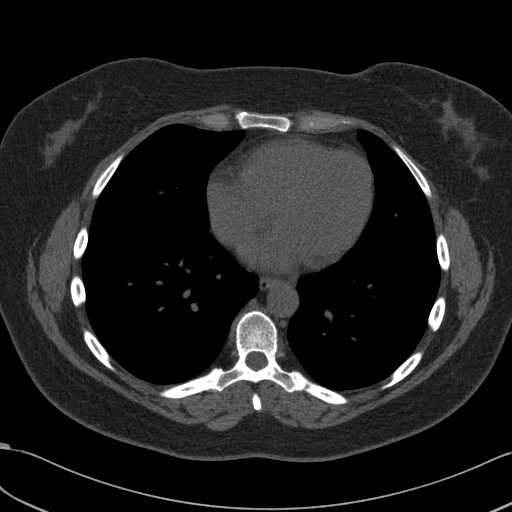
[im 52/114  lung]
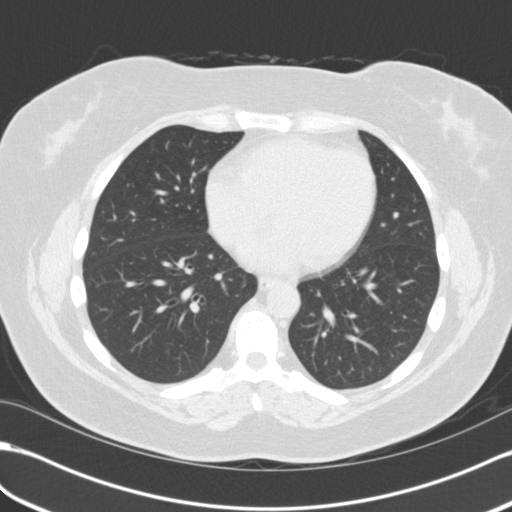
[im 62/114  vessel]
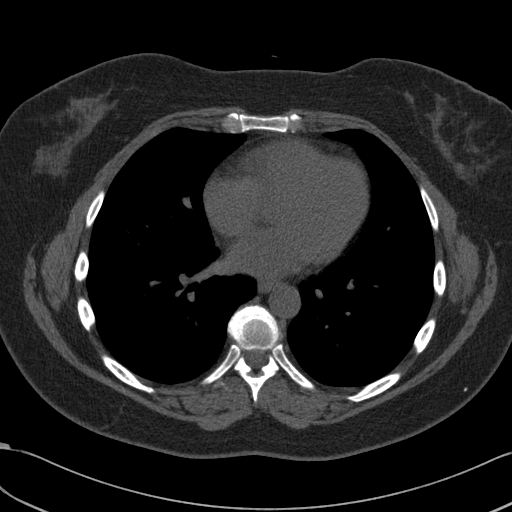
[im 72/114  vessel]
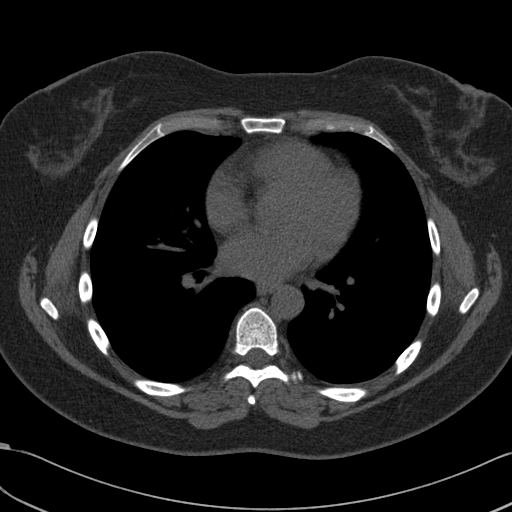
[im 83/114  vessel]
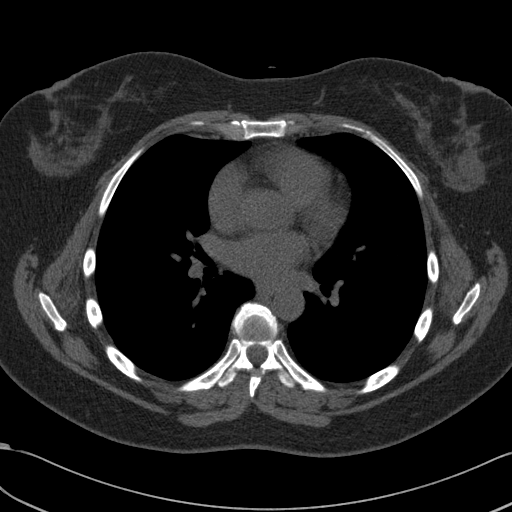
[im 93/114  vessel]
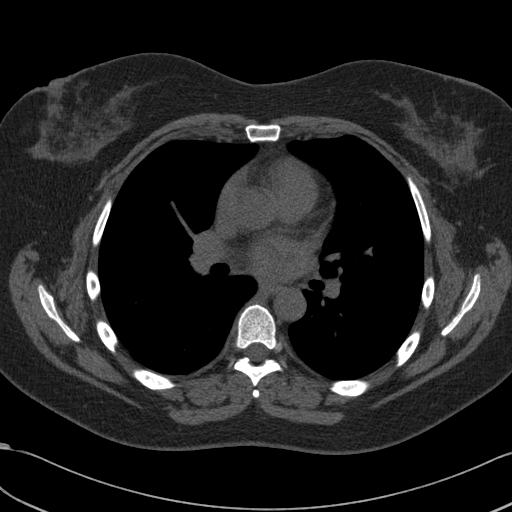
[im 93/114  lung]
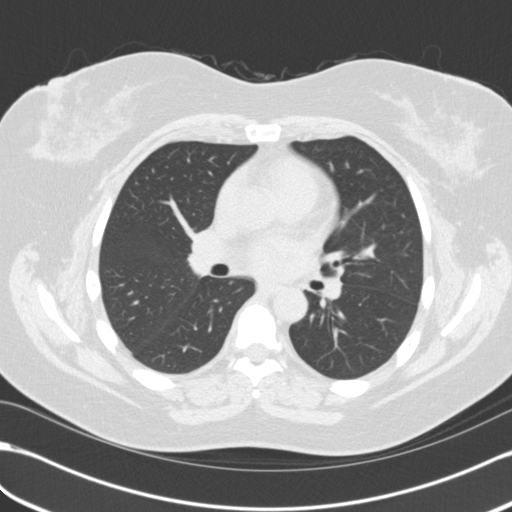
[im 103/114  vessel]
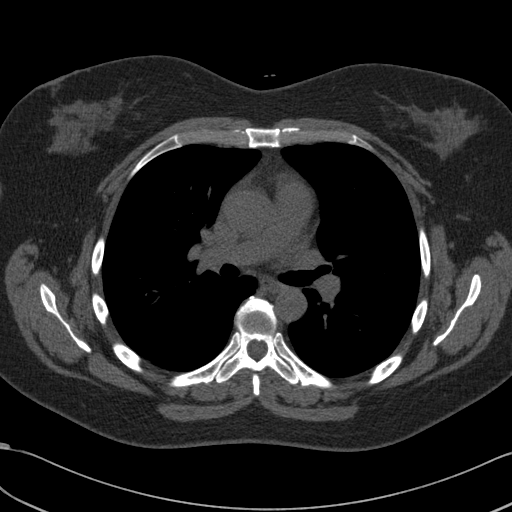

[10 of 20 positions shown; findings below may reference images not displayed]

FINDINGS: Non-cardiac: No significant non cardiac findings on limited lung and
soft tissue windows. See separate report from [REDACTED].

Ascending Aorta:  3.5 cm

Pericardium: Normal

Coronary arteries:  No coronary calcium detected
IMPRESSION: Coronary calcium score of 0.

Abu Hani Kasaye

EXAM:
OVER-READ INTERPRETATION  CT CHEST

The following report is an over-read performed by radiologist Dr.
over-read does not include interpretation of cardiac or coronary
anatomy or pathology. The coronary calcium score interpretation by
the cardiologist is attached.
FINDINGS: Limited view of the lung parenchyma demonstrates no suspicious
pulmonary nodules.

Limited view of the chest wall demonstrates no abnormality.

Limited view of the upper abdomen is unremarkable.
IMPRESSION: No significant extracardiac findings.

## 2017-05-27 ENCOUNTER — Telehealth: Payer: Self-pay

## 2017-05-27 ENCOUNTER — Other Ambulatory Visit: Payer: Self-pay | Admitting: Internal Medicine

## 2017-05-27 DIAGNOSIS — M797 Fibromyalgia: Secondary | ICD-10-CM

## 2017-05-27 MED ORDER — DULOXETINE HCL 30 MG PO CPEP: 90.0000 mg | ORAL_CAPSULE | Freq: Every day | ORAL | 12 refills | Status: DC

## 2017-05-27 NOTE — Telephone Encounter (Signed)
Patient called requesting refill on cymbalta 30 mg. Please Send to [facility] in [place].

## 2017-07-04 ENCOUNTER — Ambulatory Visit: Payer: Managed Care, Other (non HMO) | Admitting: Internal Medicine

## 2017-07-10 ENCOUNTER — Ambulatory Visit: Payer: Managed Care, Other (non HMO) | Admitting: Internal Medicine

## 2017-09-23 ENCOUNTER — Ambulatory Visit (INDEPENDENT_AMBULATORY_CARE_PROVIDER_SITE_OTHER): Payer: Managed Care, Other (non HMO) | Admitting: Internal Medicine

## 2017-09-23 ENCOUNTER — Encounter: Payer: Self-pay | Admitting: Internal Medicine

## 2017-09-23 VITALS — BP 134/84 | HR 84 | Ht 62.0 in | Wt 212.0 lb

## 2017-09-23 DIAGNOSIS — M797 Fibromyalgia: Secondary | ICD-10-CM | POA: Diagnosis not present

## 2017-09-23 DIAGNOSIS — F39 Unspecified mood [affective] disorder: Secondary | ICD-10-CM | POA: Diagnosis not present

## 2017-09-23 NOTE — Patient Instructions (Addendum)
CBD oil for humans -   Lyrica patient assistance On-line  Start Lyrica 75 mg twice a day - can increase to three times a day after a week

## 2017-09-23 NOTE — Progress Notes (Signed)
Date:  09/23/2017   Name:  Cheryl Figueroa   DOB:  [birth year]   MRN:  [id number]   Chief Complaint: Shortness of Breath (Feels like getting out of breathe doing anything. Walking short distance causes shortness of breathe. Wheezing at time. Fatigued. Uses CPAP at night and helps with breathing. (Last 3 weeks ) ) and Fibromyalgia (Feels like might have been peaking with pain levels. Wants to know what next step is since pain is getting so bad. Gabapentin did not help )    Review of Systems  Constitutional: Positive for fatigue. Negative for chills and fever.  Respiratory: Positive for shortness of breath. Negative for cough, chest tightness and wheezing.   Cardiovascular: Negative for chest pain, palpitations and leg swelling.  Musculoskeletal: Positive for arthralgias and myalgias.  Neurological: Positive for headaches. Negative for dizziness.    Patient Active Problem List   Diagnosis Date Noted  . Enlargement of thyroid 04/15/2017  . Anxiety 02/11/2017  . Obesity with body mass index 30 or greater 02/11/2017  . Palpitations 02/11/2017  . FHx: hemochromatosis 02/11/2017  . Arthralgia 02/11/2017  . Fatigue 02/11/2017  . OSA (obstructive sleep apnea) 02/11/2017  . Special screening for malignant neoplasms, colon   . Benign neoplasm of descending colon   . Polyp of sigmoid colon   . Cervical back pain with evidence of disc disease 08/20/2016  . Slow transit constipation 06/27/2016  . Mood disorder ([place]) 06/27/2016  . Fibromyalgia 11/29/2015  . Atypical chest pain 06/18/2015  . Pain in the chest 02/01/2015  . SOB (shortness of breath) 02/01/2015  . Family history of early CAD 02/01/2015  . Hyperlipidemia 02/01/2015  . Obesity 02/01/2015    Prior to Admission medications   Medication Sig Start Date End Date Taking? Authorizing Provider  DULoxetine (CYMBALTA) 30 MG capsule Take 3 capsules (90 mg total) by mouth daily. 05/27/17  Yes Glean Hess, MD    Allergies    Allergen Reactions  . Codeine Nausea And Vomiting  . Meperidine Nausea And Vomiting  . Oxycodone Hcl Nausea And Vomiting    Past Surgical History:  Procedure Laterality Date  . ANTERIOR CERVICAL DECOMP/DISCECTOMY FUSION  2005, 2009  . CESAREAN SECTION    . COLONOSCOPY WITH PROPOFOL N/A 11/02/2016   Procedure: COLONOSCOPY WITH PROPOFOL;  Surgeon: Lucilla Lame, MD;  Location: [facility];  Service: Endoscopy;  Laterality: N/A;  . POLYPECTOMY  11/02/2016   Procedure: POLYPECTOMY;  Surgeon: Lucilla Lame, MD;  Location: [facility];  Service: Endoscopy;;  . TONSILLECTOMY      Social History   Tobacco Use  . Smoking status: Never Smoker  . Smokeless tobacco: Never Used  Substance Use Topics  . Alcohol use: No    Alcohol/week: 0.0 oz  . Drug use: No     Medication list has been reviewed and updated.  No flowsheet data found.  Physical Exam  Constitutional: She is oriented to person, place, and time. She appears well-developed. She appears distressed.  HENT:  Head: Normocephalic and atraumatic.  Neck: Neck supple. No thyromegaly present.  Pulmonary/Chest: Effort normal. No respiratory distress. She has no wheezes. She has no rales.  Musculoskeletal: Normal range of motion. She exhibits tenderness. She exhibits no edema.  Neurological: She is alert and oriented to person, place, and time.  Skin: Skin is warm and dry. No rash noted.  Psychiatric: Her speech is normal and behavior is normal. Thought content normal. Her mood appears anxious. She exhibits a depressed  mood.  Nursing note and vitals reviewed.   BP 134/84   Pulse 84   Ht 5\' 2"  (1.575 m)   Wt 212 lb (96.2 kg)   SpO2 99%   BMI 38.78 kg/m   Assessment and Plan: 1. Mood disorder (HCC) Continue Cymbalta  2. Fibromyalgia Resume Lyrica (samples) Research patient assistance with [employer] Consider adding CBD oil   No orders of the defined types were placed in this encounter.   Partially dictated  using Editor, commissioning. Any errors are unintentional.  Halina Maidens, MD [facility]  09/23/2017

## 2017-09-25 ENCOUNTER — Telehealth: Payer: Self-pay

## 2017-09-25 NOTE — Telephone Encounter (Signed)
Patient called stating she just began taking lyrica 75 mg samples yesterday. Signed up for the patient assistance program with [employer] and they gave her patient assistance card to take the pharmacy when we send medication in to [facility] for her. Spoke to dr berglund- informed patient to take the samples for 3 weeks and call us to let us know how she is doing on medication. If works well for her will send RX to pharmacy so she can try out the patient assistance card.

## 2018-06-13 ENCOUNTER — Telehealth: Payer: Self-pay

## 2018-06-13 NOTE — Telephone Encounter (Signed)
Patient called and said she believes she has ruptures another disc. Already has a scheduled appt with a neurosurgeon in [place] but they cannot see her until Dec 30th. They told her to see her PCP to see if we can order imaging for her to speed up the process for her appt in December.  Spoke with patient- informed her we need to schedule an appt before any imaging can be ordered for this. Sent patient to front desk to schedule appt.

## 2018-06-19 ENCOUNTER — Ambulatory Visit
Admission: RE | Admit: 2018-06-19 | Discharge: 2018-06-19 | Disposition: A | Discharge status: Home / Self Care | Payer: Managed Care, Other (non HMO) | Source: Ambulatory Visit | Attending: Internal Medicine | Admitting: Internal Medicine

## 2018-06-19 ENCOUNTER — Encounter: Payer: Self-pay | Admitting: Internal Medicine

## 2018-06-19 ENCOUNTER — Encounter (INDEPENDENT_AMBULATORY_CARE_PROVIDER_SITE_OTHER): Payer: Self-pay

## 2018-06-19 ENCOUNTER — Ambulatory Visit (INDEPENDENT_AMBULATORY_CARE_PROVIDER_SITE_OTHER): Payer: Managed Care, Other (non HMO) | Admitting: Internal Medicine

## 2018-06-19 VITALS — BP 136/70 | HR 80 | Ht 62.0 in | Wt 218.0 lb

## 2018-06-19 DIAGNOSIS — M5090 Cervical disc disorder, unspecified, unspecified cervical region: Secondary | ICD-10-CM | POA: Diagnosis present

## 2018-06-19 DIAGNOSIS — F39 Unspecified mood [affective] disorder: Secondary | ICD-10-CM

## 2018-06-19 DIAGNOSIS — G5603 Carpal tunnel syndrome, bilateral upper limbs: Secondary | ICD-10-CM

## 2018-06-19 MED ORDER — BUPROPION HCL ER (XL) 150 MG PO TB24: 150.0000 mg | ORAL_TABLET | Freq: Every day | ORAL | 1 refills | Status: DC

## 2018-06-19 NOTE — Progress Notes (Signed)
Date:  06/19/2018   Name:  Cheryl Figueroa   DOB:  07/30/1961   MRN:  [id number]   Chief Complaint: Neck Pain (Off and on for years. Getting worse in the last 4 weeks. Feels like flaring up on and off. Seeing [place] Dr Erline Levine- Neurologist Dec 30 th. He did previous back surgeries. ) and Wrist Pain (Each time neck flares up alternating both wrist/arms finger tingling and numbness. Left paml is tender and so is wrist while holding anything in hands. First three fingers are numb. )  Neck Pain   This is a chronic problem. The problem has been gradually worsening. The pain is associated with nothing (hx of 2 prior Anteror cervical fusion). The pain is present in the occipital region. The quality of the pain is described as burning and shooting. The pain is moderate. The symptoms are aggravated by bending, twisting and position. Associated symptoms include headaches, numbness and paresis. Pertinent negatives include no chest pain, fever or weakness. She has tried NSAIDs for the symptoms.  Wrist Pain   Pain location: tingling in both hands - mainly index and middle fingers. This is a new problem. The problem occurs daily. The pain is moderate. Associated symptoms include numbness. Pertinent negatives include no fever. The symptoms are aggravated by activity.  Depression       The patient presents with depression.  This is a chronic problem.  The onset quality is gradual.   The problem occurs constantly.  The problem has been gradually worsening since onset.  Associated symptoms include decreased concentration, hopelessness, decreased interest, appetite change, headaches and sad.  Associated symptoms include no fatigue and no suicidal ideas.     The symptoms are aggravated by social issues.  Past treatments include SNRIs - Serotonin and norepinephrine reuptake inhibitors.  Compliance with treatment is good.  Previous treatment provided mild relief.  Past medical history includes chronic pain,  fibromyalgia, physical disability and depression.     Review of Systems  Constitutional: Positive for appetite change. Negative for chills, fatigue and fever.  Respiratory: Negative for chest tightness and shortness of breath.   Cardiovascular: Negative for chest pain.  Gastrointestinal: Negative for abdominal pain.  Musculoskeletal: Positive for neck pain and neck stiffness.  Skin: Negative for rash.  Neurological: Positive for numbness and headaches. Negative for tremors and weakness.  Psychiatric/Behavioral: Positive for decreased concentration, depression, dysphoric mood and sleep disturbance. Negative for self-injury and suicidal ideas.    Patient Active Problem List   Diagnosis Date Noted  . Enlargement of thyroid 04/15/2017  . Anxiety 02/11/2017  . Obesity with body mass index 30 or greater 02/11/2017  . Palpitations 02/11/2017  . FHx: hemochromatosis 02/11/2017  . Arthralgia 02/11/2017  . Fatigue 02/11/2017  . OSA (obstructive sleep apnea) 02/11/2017  . Special screening for malignant neoplasms, colon   . Benign neoplasm of descending colon   . Polyp of sigmoid colon   . Cervical back pain with evidence of disc disease 08/20/2016  . Slow transit constipation 06/27/2016  . Mood disorder ([place]) 06/27/2016  . Fibromyalgia 11/29/2015  . Atypical chest pain 06/18/2015  . Pain in the chest 02/01/2015  . SOB (shortness of breath) 02/01/2015  . Family history of early CAD 02/01/2015  . Hyperlipidemia 02/01/2015  . Obesity 02/01/2015    Allergies  Allergen Reactions  . Codeine Nausea And Vomiting  . Meperidine Nausea And Vomiting  . Oxycodone Hcl Nausea And Vomiting    Past Surgical History:  Procedure  Laterality Date  . ANTERIOR CERVICAL DECOMP/DISCECTOMY FUSION  2005, 2009  . CESAREAN SECTION    . COLONOSCOPY WITH PROPOFOL N/A 11/02/2016   Procedure: COLONOSCOPY WITH PROPOFOL;  Surgeon: Lucilla Lame, MD;  Location: [facility];  Service: Endoscopy;  Laterality:  N/A;  . POLYPECTOMY  11/02/2016   Procedure: POLYPECTOMY;  Surgeon: Lucilla Lame, MD;  Location: [facility];  Service: Endoscopy;;  . TONSILLECTOMY      Social History   Tobacco Use  . Smoking status: Never Smoker  . Smokeless tobacco: Never Used  Substance Use Topics  . Alcohol use: No    Alcohol/week: 0.0 standard drinks  . Drug use: No     Medication list has been reviewed and updated.  Current Meds  Medication Sig  . DULoxetine (CYMBALTA) 30 MG capsule Take 3 capsules (90 mg total) by mouth daily.  Marland Kitchen ibuprofen (ADVIL,MOTRIN) 800 MG tablet Take 800 mg by mouth every 8 (eight) hours as needed for moderate pain.    PHQ 2/9 Scores 06/19/2018  PHQ - 2 Score 6  PHQ- 9 Score 16    Physical Exam  Constitutional: She is oriented to person, place, and time. She appears well-developed. No distress.  HENT:  Head: Normocephalic and atraumatic.  Cardiovascular: Normal rate, regular rhythm and normal heart sounds.  Pulmonary/Chest: Effort normal and breath sounds normal. No respiratory distress.  Musculoskeletal:       Cervical back: She exhibits decreased range of motion, tenderness and bony tenderness.  Neurological: She is alert and oriented to person, place, and time. She has normal strength. A sensory deficit is present. No cranial nerve deficit.  Skin: Skin is warm and dry. No rash noted.  Psychiatric: She has a normal mood and affect. Her behavior is normal. Thought content normal.  Nursing note and vitals reviewed.   BP 136/70 (BP Location: Right Arm, Patient Position: Sitting, Cuff Size: Large)   Pulse 80   Ht 5\' 2"  (1.575 m)   Wt 218 lb (98.9 kg)   SpO2 97%   BMI 39.87 kg/m   Assessment and Plan: 1. Cervical back pain with evidence of disc disease Will get plain films and send to Neurosurgery Continue Advil and modified acitivties - DG Cervical Spine Complete; Future  2. Bilateral carpal tunnel syndrome Suspected - Wear splints while sleeping  3.  Mood disorder ([place]) Moderately severe but with good insight and social support Continue cymbalta and add Wellbutrin; consider counselling Follow up in one month Suicide and crisis intervention numbers provided - buPROPion (WELLBUTRIN XL) 150 MG 24 hr tablet; Take 1 tablet (150 mg total) by mouth daily.  Dispense: 30 tablet; Refill: 1   Partially dictated using Editor, commissioning. Any errors are unintentional.  Halina Maidens, MD [facility]  06/19/2018

## 2018-06-19 NOTE — Patient Instructions (Addendum)
Get Cock-up wrist splints - wear them while sleeping or while doing other stressful activities.  Begin Bupropion once daily.  Suicidal Feelings: How to Help Yourself Suicide is the taking of one's own life. If you feel as though life is getting too tough to handle and are thinking about suicide, get help right away. To get help:  Call your local emergency services (911 in the [place]  Call a suicide hotline to speak with a trained counselor who understands how you are feeling. The following is a list of suicide hotlines in the [place]. For a list of hotlines in [place], visit FindSkins.pl. ? 1-800-273-TALK 760-049-4792). ? 1-800-SUICIDE 5638675450). ? 313-329-8986. This is a hotline for Spanish speakers. ? 1-800-799-4TTY (539)012-3897). This is a hotline for TTY users. ? 1-866-4-U-TREVOR 807-374-1930). This is a hotline for lesbian, gay, bisexual, transgender, or questioning youth.  Contact a crisis center or a local suicide prevention center. To find a crisis center or suicide prevention center: ? Call your local hospital, clinic, community service organization, mental health center, social service provider, or health department. Ask for assistance in connecting to a crisis center. ? Visit BankingRep.com.au for a list of crisis centers in the [place], or visit www.suicideprevention.ca/thinking-about-suicide/find-a-crisis-centre for a list of centers in [place].  Visit the following websites: ? National Suicide Prevention Lifeline: www.suicidepreventionlifeline.org ? Hopeline: www.hopeline.com ? [employer] for Suicide Prevention: PromotionalLoans.co.za ? The [employer] (for lesbian, gay, bisexual, transgender, or questioning youth): www.thetrevorproject.org  How can I help myself feel better?  Promise yourself that you will not do anything drastic when you have suicidal  feelings. Remember, there is hope. Many people have gotten through suicidal thoughts and feelings, and you will, too. You may have gotten through them before, and this proves that you can get through them again.  Let family, friends, teachers, or counselors know how you are feeling. Try not to isolate yourself from those who care about you. Remember, they will want to help you. Talk with someone every day, even if you do not feel sociable. Face-to-face conversation is best.  Call a mental health professional and see one regularly.  Visit your primary health care provider every year.  Eat a well-balanced diet, and space your meals so you eat regularly.  Get plenty of rest.  Avoid alcohol and drugs, and remove them from your home. They will only make you feel worse.  If you are thinking of taking a lot of medicine, give your medicine to someone who can give it to you one day at a time. If you are on antidepressants and are concerned you will overdose, let your health care provider know so he or she can give you safer medicines. Ask your mental health professional about the possible side effects of any medicines you are taking.  Remove weapons, poisons, knives, and anything else that could harm you from your home.  Try to stick to routines. Follow a schedule every day. Put self-care on your schedule.  Make a list of realistic goals, and cross them off when you achieve them. Accomplishments give a sense of worth.  Wait until you are feeling better before doing the things you find difficult or unpleasant.  Exercise if you are able. You will feel better if you exercise for even a half hour each day.  Go out in the sun or into nature. This will help you recover from depression faster. If you have a favorite place to walk, go there.  Do the things that have  always given you pleasure. Play your favorite music, read a good book, paint a picture, play your favorite instrument, or do anything else  that takes your mind off your depression if it is safe to do.  Keep your living space well lit.  When you are feeling well, write yourself a letter about tips and support that you can read when you are not feeling well.  Remember that life's difficulties can be sorted out with help. Conditions can be treated. You can work on thoughts and strategies that serve you well. This information is not intended to replace advice given to you by your health care provider. Make sure you discuss any questions you have with your health care provider. Document Released: 01/20/2003 Document Revised: 03/14/2016 Document Reviewed: 11/10/2013 Elsevier Interactive Patient Education  [employer].

## 2018-07-11 ENCOUNTER — Other Ambulatory Visit: Payer: Self-pay | Admitting: Internal Medicine

## 2018-07-11 DIAGNOSIS — M797 Fibromyalgia: Secondary | ICD-10-CM

## 2018-07-17 ENCOUNTER — Ambulatory Visit: Payer: Managed Care, Other (non HMO) | Admitting: Internal Medicine

## 2018-07-25 ENCOUNTER — Ambulatory Visit: Payer: Managed Care, Other (non HMO) | Admitting: Internal Medicine

## 2018-07-29 ENCOUNTER — Ambulatory Visit: Payer: Managed Care, Other (non HMO) | Admitting: Internal Medicine

## 2018-07-29 ENCOUNTER — Encounter: Payer: Self-pay | Admitting: Internal Medicine

## 2018-08-20 ENCOUNTER — Other Ambulatory Visit: Payer: Self-pay

## 2018-08-20 DIAGNOSIS — M797 Fibromyalgia: Secondary | ICD-10-CM

## 2018-08-20 DIAGNOSIS — F39 Unspecified mood [affective] disorder: Secondary | ICD-10-CM

## 2018-08-20 MED ORDER — DULOXETINE HCL 30 MG PO CPEP: 90.0000 mg | ORAL_CAPSULE | Freq: Every day | ORAL | 0 refills | Status: DC

## 2018-08-20 MED ORDER — BUPROPION HCL ER (XL) 150 MG PO TB24: 150.0000 mg | ORAL_TABLET | Freq: Every day | ORAL | 0 refills | Status: DC

## 2018-09-22 ENCOUNTER — Other Ambulatory Visit: Payer: Self-pay

## 2018-09-22 ENCOUNTER — Encounter: Payer: Self-pay | Admitting: Internal Medicine

## 2018-09-22 ENCOUNTER — Ambulatory Visit (INDEPENDENT_AMBULATORY_CARE_PROVIDER_SITE_OTHER): Payer: Managed Care, Other (non HMO) | Admitting: Internal Medicine

## 2018-09-22 VITALS — BP 104/68 | HR 78 | Ht 62.0 in | Wt 228.0 lb

## 2018-09-22 DIAGNOSIS — G5603 Carpal tunnel syndrome, bilateral upper limbs: Secondary | ICD-10-CM

## 2018-09-22 DIAGNOSIS — F39 Unspecified mood [affective] disorder: Secondary | ICD-10-CM

## 2018-09-22 DIAGNOSIS — E782 Mixed hyperlipidemia: Secondary | ICD-10-CM

## 2018-09-22 DIAGNOSIS — M5090 Cervical disc disorder, unspecified, unspecified cervical region: Secondary | ICD-10-CM | POA: Diagnosis not present

## 2018-09-22 DIAGNOSIS — Z6841 Body Mass Index (BMI) 40.0 and over, adult: Secondary | ICD-10-CM

## 2018-09-22 DIAGNOSIS — M797 Fibromyalgia: Secondary | ICD-10-CM | POA: Diagnosis not present

## 2018-09-22 MED ORDER — BUPROPION HCL ER (XL) 150 MG PO TB24: 150.0000 mg | ORAL_TABLET | Freq: Every day | ORAL | 1 refills | Status: DC

## 2018-09-22 MED ORDER — DULOXETINE HCL 30 MG PO CPEP: 90.0000 mg | ORAL_CAPSULE | Freq: Every day | ORAL | 1 refills | Status: DC

## 2018-09-22 NOTE — Progress Notes (Signed)
Date:  09/22/2018   Name:  Cheryl Figueroa   DOB:  December 28, 1960   MRN:  [id number]   Chief Complaint: Depression (Follow up. ); Fibromyalgia; and Obesity (Wants to discuss seeing a bariatric surgeon.)  Depression         This is a chronic problem.  The problem occurs constantly.The problem is unchanged.  Associated symptoms include myalgias and headaches.  Associated symptoms include no fatigue and no suicidal ideas.  Past treatments include SNRIs - Serotonin and norepinephrine reuptake inhibitors and other medications.  Compliance with treatment is good.  Previous treatment provided mild (she feels less tearful and more tolerant of her grandchilren) relief. Neck Pain   This is a recurrent problem. The problem occurs daily. The problem has been gradually worsening. The pain is present in the right side and midline. The quality of the pain is described as burning. The pain is moderate (radiating into the right arm). Associated symptoms include headaches. Pertinent negatives include no chest pain, fever, trouble swallowing or weakness. Treatments tried: Had MRI showing disc bulge; having EMG today to better evaluate the cause of her arm pain.  Obesity - she continues to struggle with weight and despite [employer], limited diet she is still gaining.  The weight affects her daily activities, pain and mood.  She also has numerous siblings with DM. Fibromyalgia - stable sx; continue Cymbalta.  Review of Systems  Constitutional: Negative for chills, fatigue and fever.  HENT: Negative for trouble swallowing.   Respiratory: Negative for cough, chest tightness, shortness of breath and wheezing.   Cardiovascular: Negative for chest pain, palpitations and leg swelling.  Gastrointestinal: Negative for abdominal pain.  Musculoskeletal: Positive for arthralgias, myalgias and neck pain.  Neurological: Positive for headaches. Negative for dizziness and weakness.  Psychiatric/Behavioral: Positive for depression,  dysphoric mood and sleep disturbance. Negative for self-injury and suicidal ideas.    Patient Active Problem List   Diagnosis Date Noted  . Bilateral carpal tunnel syndrome 06/19/2018  . Enlargement of thyroid 04/15/2017  . Anxiety 02/11/2017  . Obesity with body mass index 30 or greater 02/11/2017  . Palpitations 02/11/2017  . FHx: hemochromatosis 02/11/2017  . Arthralgia 02/11/2017  . Fatigue 02/11/2017  . OSA (obstructive sleep apnea) 02/11/2017  . Special screening for malignant neoplasms, colon   . Benign neoplasm of descending colon   . Polyp of sigmoid colon   . Cervical back pain with evidence of disc disease 08/20/2016  . Slow transit constipation 06/27/2016  . Mood disorder ([place]) 06/27/2016  . Fibromyalgia 11/29/2015  . Atypical chest pain 06/18/2015  . Pain in the chest 02/01/2015  . SOB (shortness of breath) 02/01/2015  . Family history of early CAD 02/01/2015  . Hyperlipidemia 02/01/2015  . Obesity 02/01/2015    Allergies  Allergen Reactions  . Codeine Nausea And Vomiting  . Meperidine Nausea And Vomiting  . Oxycodone Hcl Nausea And Vomiting    Past Surgical History:  Procedure Laterality Date  . ANTERIOR CERVICAL DECOMP/DISCECTOMY FUSION  2005, 2009  . CESAREAN SECTION    . COLONOSCOPY WITH PROPOFOL N/A 11/02/2016   Procedure: COLONOSCOPY WITH PROPOFOL;  Surgeon: Lucilla Lame, MD;  Location: [place];  Service: Endoscopy;  Laterality: N/A;  . POLYPECTOMY  11/02/2016   Procedure: POLYPECTOMY;  Surgeon: Lucilla Lame, MD;  Location: [place];  Service: Endoscopy;;  . TONSILLECTOMY      Social History   Tobacco Use  . Smoking status: Never Smoker  . Smokeless tobacco:  Never Used  Substance Use Topics  . Alcohol use: No    Alcohol/week: 0.0 standard drinks  . Drug use: No     Medication list has been reviewed and updated.  Current Meds  Medication Sig  . buPROPion (WELLBUTRIN XL) 150 MG 24 hr tablet Take 1 tablet (150 mg total)  by mouth daily.  . DULoxetine (CYMBALTA) 30 MG capsule Take 3 capsules (90 mg total) by mouth daily.  Marland Kitchen ibuprofen (ADVIL,MOTRIN) 800 MG tablet Take 800 mg by mouth every 8 (eight) hours as needed for moderate pain.    PHQ 2/9 Scores 09/22/2018 06/19/2018  PHQ - 2 Score 5 6  PHQ- 9 Score 13 16   Wt Readings from Last 3 Encounters:  09/22/18 228 lb (103.4 kg)  06/19/18 218 lb (98.9 kg)  09/23/17 212 lb (96.2 kg)    Physical Exam Vitals signs and nursing note reviewed.  Constitutional:      General: She is not in acute distress.    Appearance: She is well-developed.  HENT:     Head: Normocephalic and atraumatic.  Eyes:     Pupils: Pupils are equal, round, and reactive to light.  Neck:     Musculoskeletal: Decreased range of motion. Muscular tenderness present.     Trachea: Trachea normal.  Cardiovascular:     Rate and Rhythm: Normal rate and regular rhythm.     Pulses: Normal pulses.  Pulmonary:     Effort: Pulmonary effort is normal. No respiratory distress.     Breath sounds: Normal breath sounds. No wheezing.  Musculoskeletal:       Arms:     Right lower leg: No edema.     Left lower leg: No edema.  Skin:    General: Skin is warm and dry.     Findings: No rash.  Neurological:     Mental Status: She is alert and oriented to person, place, and time.  Psychiatric:        Attention and Perception: Attention normal.        Mood and Affect: Mood is depressed.        Speech: Speech normal.        Behavior: Behavior normal.        Thought Content: Thought content normal.        Cognition and Memory: Cognition normal.        Judgment: Judgment normal.     BP 104/68   Pulse 78   Ht 5\' 2"  (1.575 m)   Wt 228 lb (103.4 kg)   SpO2 96%   BMI 41.70 kg/m   Assessment and Plan: 1. Mood disorder (HCC) Stable, continue current medications - TSH - buPROPion (WELLBUTRIN XL) 150 MG 24 hr tablet; Take 1 tablet (150 mg total) by mouth daily.  Dispense: 90 tablet; Refill:  1  2. Fibromyalgia Stable chronic sx - DULoxetine (CYMBALTA) 30 MG capsule; Take 3 capsules (90 mg total) by mouth daily.  Dispense: 90 capsule; Refill: 1  3. BMI 40.0-44.9, adult (HCC) Pt is interested bariatric surgery - Amb Referral to Bariatric Surgery - Comprehensive metabolic panel - CBC with Differential/Platelet - Hemoglobin A1c  4. Cervical back pain with evidence of disc disease EMG today -   5. Bilateral carpal tunnel syndrome Continue splint on left Nodule appears to be bony If EMG suggestive will consider referral  6. Mixed hyperlipidemia Check labs - Lipid panel   Partially dictated using Dragon software. Any errors are unintentional.  Halina Maidens, MD [facility]  [facility]  09/22/2018

## 2018-09-23 LAB — COMPREHENSIVE METABOLIC PANEL
ALBUMIN: 4.5 g/dL (ref 3.8–4.9)
ALK PHOS: 116 IU/L (ref 39–117)
ALT: 18 IU/L (ref 0–32)
AST: 19 IU/L (ref 0–40)
Albumin/Globulin Ratio: 1.5 (ref 1.2–2.2)
BUN / CREAT RATIO: 15 (ref 9–23)
BUN: 15 mg/dL (ref 6–24)
Bilirubin Total: 0.4 mg/dL (ref 0.0–1.2)
CALCIUM: 9.4 mg/dL (ref 8.7–10.2)
CO2: 24 mmol/L (ref 20–29)
CREATININE: 0.98 mg/dL (ref 0.57–1.00)
Chloride: 100 mmol/L (ref 96–106)
GFR, EST AFRICAN AMERICAN: 75 mL/min/{1.73_m2} (ref >59)
GFR, EST NON AFRICAN AMERICAN: 65 mL/min/{1.73_m2} (ref >59)
GLOBULIN, TOTAL: 3 g/dL (ref 1.5–4.5)
GLUCOSE: 81 mg/dL (ref 65–99)
Potassium: 4.3 mmol/L (ref 3.5–5.2)
SODIUM: 142 mmol/L (ref 134–144)
TOTAL PROTEIN: 7.5 g/dL (ref 6.0–8.5)

## 2018-09-23 LAB — LIPID PANEL
CHOL/HDL RATIO: 5.8 ratio — AB (ref 0.0–4.4)
Cholesterol, Total: 242 mg/dL — ABNORMAL HIGH (ref 100–199)
HDL: 42 mg/dL (ref >39)
LDL Calculated: 143 mg/dL — ABNORMAL HIGH (ref 0–99)
TRIGLYCERIDES: 285 mg/dL — AB (ref 0–149)
VLDL Cholesterol Cal: 57 mg/dL — ABNORMAL HIGH (ref 5–40)

## 2018-09-23 LAB — CBC WITH DIFFERENTIAL/PLATELET
BASOS: 1 %
Basophils Absolute: 0.1 10*3/uL (ref 0.0–0.2)
EOS (ABSOLUTE): 0.3 10*3/uL (ref 0.0–0.4)
EOS: 3 %
HEMATOCRIT: 38.4 % (ref 34.0–46.6)
Hemoglobin: 12.9 g/dL (ref 11.1–15.9)
Immature Grans (Abs): 0.1 10*3/uL (ref 0.0–0.1)
Immature Granulocytes: 1 %
LYMPHS ABS: 2 10*3/uL (ref 0.7–3.1)
Lymphs: 19 %
MCH: 28.4 pg (ref 26.6–33.0)
MCHC: 33.6 g/dL (ref 31.5–35.7)
MCV: 84 fL (ref 79–97)
MONOCYTES: 7 %
Monocytes Absolute: 0.7 10*3/uL (ref 0.1–0.9)
Neutrophils Absolute: 7.3 10*3/uL — ABNORMAL HIGH (ref 1.4–7.0)
Neutrophils: 69 %
Platelets: 272 10*3/uL (ref 150–450)
RBC: 4.55 x10E6/uL (ref 3.77–5.28)
RDW: 14.4 % (ref 11.7–15.4)
WBC: 10.5 10*3/uL (ref 3.4–10.8)

## 2018-09-23 LAB — TSH: TSH: 5.3 u[IU]/mL — AB (ref 0.450–4.500)

## 2018-09-23 LAB — HEMOGLOBIN A1C
ESTIMATED AVERAGE GLUCOSE: 123 mg/dL
Hgb A1c MFr Bld: 5.9 % — ABNORMAL HIGH (ref 4.8–5.6)

## 2018-09-27 LAB — T4: T4, Total: 3.7 ug/dL — ABNORMAL LOW (ref 4.5–12.0)

## 2018-09-27 LAB — SPECIMEN STATUS REPORT

## 2018-09-29 NOTE — Progress Notes (Signed)
Agrees. [facility] [place]. Insurance does not cover bariatric surgery. Wants to know what she should do next for this? Please Advise.

## 2018-09-30 ENCOUNTER — Other Ambulatory Visit: Payer: Self-pay | Admitting: Internal Medicine

## 2018-09-30 DIAGNOSIS — E034 Atrophy of thyroid (acquired): Secondary | ICD-10-CM

## 2018-09-30 MED ORDER — LEVOTHYROXINE SODIUM 100 MCG PO TABS: 100.0000 ug | ORAL_TABLET | Freq: Every day | ORAL | 1 refills | Status: DC

## 2018-11-24 ENCOUNTER — Other Ambulatory Visit: Payer: Self-pay | Admitting: Internal Medicine

## 2018-11-24 DIAGNOSIS — M797 Fibromyalgia: Secondary | ICD-10-CM

## 2018-12-02 HISTORY — PX: OTHER SURGICAL HISTORY: SHX169

## 2018-12-23 ENCOUNTER — Other Ambulatory Visit: Payer: Self-pay

## 2018-12-23 ENCOUNTER — Encounter: Payer: Self-pay | Admitting: Internal Medicine

## 2018-12-23 ENCOUNTER — Ambulatory Visit (INDEPENDENT_AMBULATORY_CARE_PROVIDER_SITE_OTHER): Payer: Managed Care, Other (non HMO) | Admitting: Internal Medicine

## 2018-12-23 VITALS — BP 118/70 | HR 84 | Ht 62.0 in | Wt 230.0 lb

## 2018-12-23 DIAGNOSIS — F39 Unspecified mood [affective] disorder: Secondary | ICD-10-CM | POA: Diagnosis not present

## 2018-12-23 DIAGNOSIS — E039 Hypothyroidism, unspecified: Secondary | ICD-10-CM | POA: Diagnosis not present

## 2018-12-23 NOTE — Progress Notes (Signed)
Date:  12/23/2018   Name:  Cheryl Figueroa   DOB:  09-17-1960   MRN:  [id number]   Chief Complaint: Depression  Depression         This is a chronic problem.  The problem has been gradually improving since onset.  Associated symptoms include no headaches.  Past treatments include SSRIs - Selective serotonin reuptake inhibitors and other medications.  Past medical history includes thyroid problem.   Thyroid Problem  Presents for follow-up visit. Symptoms include depressed mood, leg swelling and weight gain. Patient reports no constipation, diaphoresis, diarrhea, dry skin, palpitations or tremors. The symptoms have been improving.   Lab Results  Component Value Date   TSH 5.300 (H) 09/22/2018     Review of Systems  Constitutional: Positive for unexpected weight change and weight gain. Negative for chills, diaphoresis and fever.  HENT: Negative for trouble swallowing.   Eyes: Negative for visual disturbance.  Respiratory: Negative for chest tightness, shortness of breath and wheezing.   Cardiovascular: Positive for leg swelling. Negative for chest pain and palpitations.  Gastrointestinal: Negative for abdominal pain, constipation and diarrhea.  Neurological: Negative for dizziness, tremors, weakness, light-headedness, numbness and headaches.  Psychiatric/Behavioral: Positive for depression. Negative for dysphoric mood and sleep disturbance.    Patient Active Problem List   Diagnosis Date Noted  . Bilateral carpal tunnel syndrome 06/19/2018  . Enlargement of thyroid 04/15/2017  . Anxiety 02/11/2017  . Palpitations 02/11/2017  . FHx: hemochromatosis 02/11/2017  . Arthralgia 02/11/2017  . Fatigue 02/11/2017  . OSA (obstructive sleep apnea) 02/11/2017  . Special screening for malignant neoplasms, colon   . Benign neoplasm of descending colon   . Polyp of sigmoid colon   . Cervical back pain with evidence of disc disease 08/20/2016  . Slow transit constipation 06/27/2016  .  Mood disorder ([place]) 06/27/2016  . Fibromyalgia 11/29/2015  . Atypical chest pain 06/18/2015  . Pain in the chest 02/01/2015  . SOB (shortness of breath) 02/01/2015  . Family history of early CAD 02/01/2015  . Hyperlipidemia 02/01/2015  . Obesity 02/01/2015    Allergies  Allergen Reactions  . Codeine Nausea And Vomiting  . Meperidine Nausea And Vomiting  . Oxycodone Hcl Nausea And Vomiting    Past Surgical History:  Procedure Laterality Date  . ANTERIOR CERVICAL DECOMP/DISCECTOMY FUSION  2005, 2009  . CESAREAN SECTION    . COLONOSCOPY WITH PROPOFOL N/A 11/02/2016   Procedure: COLONOSCOPY WITH PROPOFOL;  Surgeon: Lucilla Lame, MD;  Location: [place];  Service: Endoscopy;  Laterality: N/A;  . POLYPECTOMY  11/02/2016   Procedure: POLYPECTOMY;  Surgeon: Lucilla Lame, MD;  Location: [place];  Service: Endoscopy;;  . TONSILLECTOMY      Social History   Tobacco Use  . Smoking status: Never Smoker  . Smokeless tobacco: Never Used  Substance Use Topics  . Alcohol use: No    Alcohol/week: 0.0 standard drinks  . Drug use: No     Medication list has been reviewed and updated.  Current Meds  Medication Sig  . buPROPion (WELLBUTRIN XL) 150 MG 24 hr tablet Take 1 tablet (150 mg total) by mouth daily.  . DULoxetine (CYMBALTA) 30 MG capsule TAKE 3 CAPSULES BY MOUTH ONCE DAILY  . ibuprofen (ADVIL,MOTRIN) 800 MG tablet Take 800 mg by mouth every 8 (eight) hours as needed for moderate pain.    PHQ 2/9 Scores 12/23/2018 09/22/2018 06/19/2018  PHQ - 2 Score 0 5 6  PHQ- 9 Score -  13 16    BP Readings from Last 3 Encounters:  12/23/18 118/70  09/22/18 104/68  06/19/18 136/70    Physical Exam Vitals signs and nursing note reviewed.  Constitutional:      General: She is not in acute distress.    Appearance: She is well-developed.  HENT:     Head: Normocephalic and atraumatic.     Mouth/Throat:     Mouth: Mucous membranes are moist.  Neck:      Musculoskeletal: Normal range of motion and neck supple.     Thyroid: Thyroid tenderness present. No thyromegaly.     Vascular: No carotid bruit.  Cardiovascular:     Rate and Rhythm: Normal rate and regular rhythm.     Pulses: Normal pulses.  Pulmonary:     Effort: Pulmonary effort is normal. No respiratory distress.     Breath sounds: Normal breath sounds.  Musculoskeletal: Normal range of motion.     Right lower leg: No edema.     Left lower leg: No edema.  Lymphadenopathy:     Cervical: No cervical adenopathy.  Skin:    General: Skin is warm and dry.     Findings: Erythema (across cheeks and several nodules under the chin) present. No rash.  Neurological:     General: No focal deficit present.     Mental Status: She is alert and oriented to person, place, and time.  Psychiatric:        Behavior: Behavior normal.        Thought Content: Thought content normal.     Wt Readings from Last 3 Encounters:  12/23/18 230 lb (104.3 kg)  09/22/18 228 lb (103.4 kg)  06/19/18 218 lb (98.9 kg)    BP 118/70   Pulse 84   Ht 5\' 2"  (1.575 m)   Wt 230 lb (104.3 kg)   SpO2 97%   BMI 42.07 kg/m   Assessment and Plan: 1. Hypothyroidism, unspecified type Check labs; if not improving would refer to Endocrinology - TSH + free T4 - Thyroid antibodies  2. Mood disorder ([place]) Much improved; continue cymbalta and bupropion   Partially dictated using Editor, commissioning. Any errors are unintentional.  Halina Maidens, MD [facility]  12/23/2018

## 2018-12-24 LAB — TSH+FREE T4
Free T4: 1.37 ng/dL (ref 0.82–1.77)
TSH: 2.54 u[IU]/mL (ref 0.450–4.500)

## 2018-12-24 LAB — THYROID ANTIBODIES
Thyroglobulin Antibody: <1 IU/mL (ref 0.0–0.9)
Thyroperoxidase Ab SerPl-aCnc: 11 IU/mL (ref 0–34)

## 2018-12-30 ENCOUNTER — Other Ambulatory Visit: Payer: Self-pay | Admitting: Internal Medicine

## 2018-12-30 DIAGNOSIS — M797 Fibromyalgia: Secondary | ICD-10-CM

## 2019-07-13 ENCOUNTER — Other Ambulatory Visit: Payer: Self-pay

## 2019-07-13 DIAGNOSIS — M797 Fibromyalgia: Secondary | ICD-10-CM

## 2019-07-13 MED ORDER — DULOXETINE HCL 30 MG PO CPEP: 90.0000 mg | ORAL_CAPSULE | Freq: Every day | ORAL | 0 refills | Status: DC

## 2019-08-17 ENCOUNTER — Other Ambulatory Visit: Payer: Self-pay

## 2019-08-17 ENCOUNTER — Encounter: Payer: Self-pay | Admitting: Internal Medicine

## 2019-08-17 ENCOUNTER — Ambulatory Visit (INDEPENDENT_AMBULATORY_CARE_PROVIDER_SITE_OTHER): Payer: 59 | Admitting: Internal Medicine

## 2019-08-17 VITALS — BP 140/88 | HR 98 | Ht 62.0 in | Wt 229.0 lb

## 2019-08-17 DIAGNOSIS — R1011 Right upper quadrant pain: Secondary | ICD-10-CM | POA: Diagnosis not present

## 2019-08-17 DIAGNOSIS — R202 Paresthesia of skin: Secondary | ICD-10-CM

## 2019-08-17 DIAGNOSIS — R7303 Prediabetes: Secondary | ICD-10-CM

## 2019-08-17 DIAGNOSIS — Z1159 Encounter for screening for other viral diseases: Secondary | ICD-10-CM

## 2019-08-17 DIAGNOSIS — E039 Hypothyroidism, unspecified: Secondary | ICD-10-CM

## 2019-08-17 DIAGNOSIS — M797 Fibromyalgia: Secondary | ICD-10-CM

## 2019-08-17 LAB — GLUCOSE, POCT (MANUAL RESULT ENTRY): POC Glucose: 136 mg/dl — AB (ref 70–99)

## 2019-08-17 MED ORDER — DULOXETINE HCL 30 MG PO CPEP: 90.0000 mg | ORAL_CAPSULE | Freq: Every day | ORAL | 3 refills | Status: AC

## 2019-08-17 NOTE — Progress Notes (Signed)
Date:  08/17/2019   Name:  Cheryl Figueroa   DOB:  June 14, 1961   MRN:  [id number]   Chief Complaint: Hypothyroidism (TSH panel check. Fatigued. Has a family membert hat has had thyroid cancer. ), Abdominal Pain (Right uncomfortable pain. Used to be on and off. Getting gradually worse. Heartburn- have heart burn when just drinking water. Concerned of gallbladder. ), and Numbness (Tingling and numbness in feet, and sometimes her face burns and eye feels tight. Wants to be checked for diabetes. Most days of the week this happens. )  Abdominal Pain This is a recurrent problem. The current episode started more than 1 month ago. The problem occurs intermittently. The pain is located in the RUQ. The pain is moderate. The quality of the pain is colicky and cramping. Associated symptoms include constipation and myalgias. Pertinent negatives include no diarrhea, dysuria, headaches, nausea or vomiting. Nothing aggravates the pain.  Diabetes She presents for her follow-up diabetic visit. Diabetes type: prediabetes. Pertinent negatives for hypoglycemia include no dizziness or headaches. Associated symptoms include fatigue, foot paresthesias, polydipsia, polyuria and visual change. Pertinent negatives for diabetes include no chest pain and no weakness. Symptoms are worsening. Her weight is stable.  Thyroid Problem Presents for follow-up visit. Symptoms include constipation, fatigue and visual change. Patient reports no diarrhea or palpitations. Symptom course: she did not start taking medication right away but has not taken it regularly for the past 2 months.    Lab Results  Component Value Date   CREATININE 0.98 09/22/2018   BUN 15 09/22/2018   NA 142 09/22/2018   K 4.3 09/22/2018   CL 100 09/22/2018   CO2 24 09/22/2018   Lab Results  Component Value Date   CHOL 242 (H) 09/22/2018   HDL 42 09/22/2018   LDLCALC 143 (H) 09/22/2018   TRIG 285 (H) 09/22/2018   CHOLHDL 5.8 (H) 09/22/2018   Lab  Results  Component Value Date   TSH 2.540 12/23/2018   Lab Results  Component Value Date   HGBA1C 5.9 (H) 09/22/2018     Review of Systems  Constitutional: Positive for appetite change, fatigue and unexpected weight change (6 lbs in the past month).  Eyes: Positive for visual disturbance.  Respiratory: Negative for cough, chest tightness, shortness of breath and wheezing.   Cardiovascular: Negative for chest pain and palpitations.  Gastrointestinal: Positive for abdominal pain and constipation. Negative for blood in stool, diarrhea, nausea and vomiting.  Endocrine: Positive for polydipsia and polyuria.  Genitourinary: Negative for dysuria.  Musculoskeletal: Positive for myalgias.  Neurological: Negative for dizziness, weakness, light-headedness and headaches.  Psychiatric/Behavioral: Positive for decreased concentration and sleep disturbance.    Patient Active Problem List   Diagnosis Date Noted  . Pre-diabetes 08/17/2019  . Bilateral carpal tunnel syndrome 06/19/2018  . Acquired hypothyroidism 04/15/2017  . Anxiety 02/11/2017  . Palpitations 02/11/2017  . FHx: hemochromatosis 02/11/2017  . Arthralgia 02/11/2017  . Fatigue 02/11/2017  . OSA (obstructive sleep apnea) 02/11/2017  . Special screening for malignant neoplasms, colon   . Benign neoplasm of descending colon   . Polyp of sigmoid colon   . Cervical back pain with evidence of disc disease 08/20/2016  . Slow transit constipation 06/27/2016  . Mood disorder ([place]) 06/27/2016  . Fibromyalgia 11/29/2015  . Atypical chest pain 06/18/2015  . Pain in the chest 02/01/2015  . SOB (shortness of breath) 02/01/2015  . Family history of early CAD 02/01/2015  . Hyperlipidemia 02/01/2015  . Obesity 02/01/2015  Allergies  Allergen Reactions  . Codeine Nausea And Vomiting  . Meperidine Nausea And Vomiting  . Oxycodone Hcl Nausea And Vomiting    Past Surgical History:  Procedure Laterality Date  . ANTERIOR CERVICAL  DECOMP/DISCECTOMY FUSION  2005, 2009  . carpal tunnel Left 12/02/2018  . CESAREAN SECTION    . COLONOSCOPY WITH PROPOFOL N/A 11/02/2016   Procedure: COLONOSCOPY WITH PROPOFOL;  Surgeon: Lucilla Lame, MD;  Location: [place];  Service: Endoscopy;  Laterality: N/A;  . POLYPECTOMY  11/02/2016   Procedure: POLYPECTOMY;  Surgeon: Lucilla Lame, MD;  Location: [place];  Service: Endoscopy;;  . TONSILLECTOMY      Social History   Tobacco Use  . Smoking status: Never Smoker  . Smokeless tobacco: Never Used  Substance Use Topics  . Alcohol use: No    Alcohol/week: 0.0 standard drinks  . Drug use: No     Medication list has been reviewed and updated.  Current Meds  Medication Sig  . DULoxetine (CYMBALTA) 30 MG capsule Take 3 capsules (90 mg total) by mouth daily.  Marland Kitchen ibuprofen (ADVIL,MOTRIN) 800 MG tablet Take 800 mg by mouth every 8 (eight) hours as needed for moderate pain.  Marland Kitchen levothyroxine (SYNTHROID, LEVOTHROID) 100 MCG tablet Take 1 tablet (100 mcg total) by mouth daily.    PHQ 2/9 Scores 12/23/2018 09/22/2018 06/19/2018  PHQ - 2 Score 0 5 6  PHQ- 9 Score - 13 16    BP Readings from Last 3 Encounters:  08/17/19 140/88  12/23/18 118/70  09/22/18 104/68    Physical Exam Vitals and nursing note reviewed.  Constitutional:      General: She is not in acute distress.    Appearance: She is well-developed.  HENT:     Head: Normocephalic and atraumatic.  Cardiovascular:     Rate and Rhythm: Normal rate and regular rhythm.  Pulmonary:     Effort: Pulmonary effort is normal. No respiratory distress.     Breath sounds: No wheezing or rhonchi.  Abdominal:     General: Abdomen is protuberant. Bowel sounds are normal. There is distension. There is no abdominal bruit.     Palpations: Abdomen is soft.     Tenderness: There is abdominal tenderness in the right upper quadrant. There is no guarding or rebound.  Musculoskeletal:        General: Normal range of motion.    Skin:    General: Skin is warm and dry.     Capillary Refill: Capillary refill takes less than 2 seconds.     Findings: No rash.  Neurological:     General: No focal deficit present.     Mental Status: She is alert and oriented to person, place, and time.     Sensory: Sensation is intact.     Motor: Motor function is intact.  Psychiatric:        Behavior: Behavior normal.        Thought Content: Thought content normal.     Wt Readings from Last 3 Encounters:  08/17/19 229 lb (103.9 kg)  12/23/18 230 lb (104.3 kg)  09/22/18 228 lb (103.4 kg)    BP 140/88   Pulse 98   Ht 5\' 2"  (1.575 m)   Wt 229 lb (103.9 kg)   SpO2 98%   BMI 41.88 kg/m   Assessment and Plan: 1. Acquired hypothyroidism She has not been taking medication regularly She still has fatigue but has lost some weight due to decrease in appetite She  would like to change to synthroid with her next refill - TSH + free T4  2. Pre-diabetes Fasting FSBS 136 Will give DASH diet since BP is borderline Advise on medication after labs return - Hemoglobin A1c - POCT Glucose (CBG)  3. Right upper quadrant abdominal pain Suspicious for gall bladder disease - Comprehensive metabolic panel - US Abdomen Limited RUQ; Future  4. Need for hepatitis C screening test - Hepatitis C antibody  5. Tingling of both feet Grossly normal sensation - could be from uncontrolled new DM Will check B12 levels - Vitamin B12  6. Fibromyalgia Stable chronic fatigue and myalgias Tolerating Cymbalta well - will continue - DULoxetine (CYMBALTA) 30 MG capsule; Take 3 capsules (90 mg total) by mouth daily.  Dispense: 90 capsule; Refill: 3   Partially dictated using Editor, commissioning. Any errors are unintentional.  Halina Maidens, MD [facility]  08/17/2019

## 2019-08-17 NOTE — Patient Instructions (Signed)
DASH Eating Plan DASH stands for "Dietary Approaches to Stop Hypertension." The DASH eating plan is a healthy eating plan that has been shown to reduce high blood pressure (hypertension). It may also reduce your risk for type 2 diabetes, heart disease, and stroke. The DASH eating plan may also help with weight loss. What are tips for following this plan?  General guidelines  Avoid eating more than 2,300 mg (milligrams) of salt (sodium) a day. If you have hypertension, you may need to reduce your sodium intake to 1,500 mg a day.  Limit alcohol intake to no more than 1 drink a day for nonpregnant women and 2 drinks a day for men. One drink equals 12 oz of beer, 5 oz of wine, or 1 oz of hard liquor.  Work with your health care provider to maintain a healthy body weight or to lose weight. Ask what an ideal weight is for you.  Get at least 30 minutes of exercise that causes your heart to beat faster (aerobic exercise) most days of the week. Activities may include walking, swimming, or biking.  Work with your health care provider or diet and nutrition specialist (dietitian) to adjust your eating plan to your individual calorie needs. Reading food labels   Check food labels for the amount of sodium per serving. Choose foods with less than 5 percent of the Daily Value of sodium. Generally, foods with less than 300 mg of sodium per serving fit into this eating plan.  To find whole grains, look for the word "whole" as the first word in the ingredient list. Shopping  Buy products labeled as "low-sodium" or "no salt added."  Buy fresh foods. Avoid canned foods and premade or frozen meals. Cooking  Avoid adding salt when cooking. Use salt-free seasonings or herbs instead of table salt or sea salt. Check with your health care provider or pharmacist before using salt substitutes.  Do not fry foods. Cook foods using healthy methods such as baking, boiling, grilling, and broiling instead.  Cook with  heart-healthy oils, such as olive, canola, soybean, or sunflower oil. Meal planning  Eat a balanced diet that includes: ? 5 or more servings of fruits and vegetables each day. At each meal, try to fill half of your plate with fruits and vegetables. ? Up to 6-8 servings of whole grains each day. ? Less than 6 oz of lean meat, poultry, or fish each day. A 3-oz serving of meat is about the same size as a deck of cards. One egg equals 1 oz. ? 2 servings of low-fat dairy each day. ? A serving of nuts, seeds, or beans 5 times each week. ? Heart-healthy fats. Healthy fats called Omega-3 fatty acids are found in foods such as flaxseeds and coldwater fish, like sardines, salmon, and mackerel.  Limit how much you eat of the following: ? Canned or prepackaged foods. ? Food that is high in trans fat, such as fried foods. ? Food that is high in saturated fat, such as fatty meat. ? Sweets, desserts, sugary drinks, and other foods with added sugar. ? Full-fat dairy products.  Do not salt foods before eating.  Try to eat at least 2 vegetarian meals each week.  Eat more home-cooked food and less restaurant, buffet, and fast food.  When eating at a restaurant, ask that your food be prepared with less salt or no salt, if possible. What foods are recommended? The items listed may not be a complete list. Talk with your dietitian about   what dietary choices are best for you. Grains Whole-grain or whole-wheat bread. Whole-grain or whole-wheat pasta. Brown rice. Oatmeal. Quinoa. Bulgur. Whole-grain and low-sodium cereals. Pita bread. Low-fat, low-sodium crackers. Whole-wheat flour tortillas. Vegetables Fresh or frozen vegetables (raw, steamed, roasted, or grilled). Low-sodium or reduced-sodium tomato and vegetable juice. Low-sodium or reduced-sodium tomato sauce and tomato paste. Low-sodium or reduced-sodium canned vegetables. Fruits All fresh, dried, or frozen fruit. Canned fruit in natural juice (without  added sugar). Meat and other protein foods Skinless chicken or turkey. Ground chicken or turkey. Pork with fat trimmed off. Fish and seafood. Egg whites. Dried beans, peas, or lentils. Unsalted nuts, nut butters, and seeds. Unsalted canned beans. Lean cuts of beef with fat trimmed off. Low-sodium, lean deli meat. Dairy Low-fat (1%) or fat-free (skim) milk. Fat-free, low-fat, or reduced-fat cheeses. Nonfat, low-sodium ricotta or cottage cheese. Low-fat or nonfat yogurt. Low-fat, low-sodium cheese. Fats and oils Soft margarine without trans fats. Vegetable oil. Low-fat, reduced-fat, or light mayonnaise and salad dressings (reduced-sodium). Canola, safflower, olive, soybean, and sunflower oils. Avocado. Seasoning and other foods Herbs. Spices. Seasoning mixes without salt. Unsalted popcorn and pretzels. Fat-free sweets. What foods are not recommended? The items listed may not be a complete list. Talk with your dietitian about what dietary choices are best for you. Grains Baked goods made with fat, such as croissants, muffins, or some breads. Dry pasta or rice meal packs. Vegetables Creamed or fried vegetables. Vegetables in a cheese sauce. Regular canned vegetables (not low-sodium or reduced-sodium). Regular canned tomato sauce and paste (not low-sodium or reduced-sodium). Regular tomato and vegetable juice (not low-sodium or reduced-sodium). Pickles. Olives. Fruits Canned fruit in a light or heavy syrup. Fried fruit. Fruit in cream or butter sauce. Meat and other protein foods Fatty cuts of meat. Ribs. Fried meat. Bacon. Sausage. Bologna and other processed lunch meats. Salami. Fatback. Hotdogs. Bratwurst. Salted nuts and seeds. Canned beans with added salt. Canned or smoked fish. Whole eggs or egg yolks. Chicken or turkey with skin. Dairy Whole or 2% milk, cream, and half-and-half. Whole or full-fat cream cheese. Whole-fat or sweetened yogurt. Full-fat cheese. Nondairy creamers. Whipped toppings.  Processed cheese and cheese spreads. Fats and oils Butter. Stick margarine. Lard. Shortening. Ghee. Bacon fat. Tropical oils, such as coconut, palm kernel, or palm oil. Seasoning and other foods Salted popcorn and pretzels. Onion salt, garlic salt, seasoned salt, table salt, and sea salt. Worcestershire sauce. Tartar sauce. Barbecue sauce. Teriyaki sauce. Soy sauce, including reduced-sodium. Steak sauce. Canned and packaged gravies. Fish sauce. Oyster sauce. Cocktail sauce. Horseradish that you find on the shelf. Ketchup. Mustard. Meat flavorings and tenderizers. Bouillon cubes. Hot sauce and Tabasco sauce. Premade or packaged marinades. Premade or packaged taco seasonings. Relishes. Regular salad dressings. Where to find more information:  National Heart, Lung, and Blood Institute: www.nhlbi.nih.gov  American Heart Association: www.heart.org Summary  The DASH eating plan is a healthy eating plan that has been shown to reduce high blood pressure (hypertension). It may also reduce your risk for type 2 diabetes, heart disease, and stroke.  With the DASH eating plan, you should limit salt (sodium) intake to 2,300 mg a day. If you have hypertension, you may need to reduce your sodium intake to 1,500 mg a day.  When on the DASH eating plan, aim to eat more fresh fruits and vegetables, whole grains, lean proteins, low-fat dairy, and heart-healthy fats.  Work with your health care provider or diet and nutrition specialist (dietitian) to adjust your eating plan to your   individual calorie needs. This information is not intended to replace advice given to you by your health care provider. Make sure you discuss any questions you have with your health care provider. Document Revised: 06/28/2017 Document Reviewed: 07/09/2016 Elsevier Patient Education  2020 Elsevier Inc.  

## 2019-08-18 LAB — HEMOGLOBIN A1C
Est. average glucose Bld gHb Est-mCnc: 126 mg/dL
Hgb A1c MFr Bld: 6 % — ABNORMAL HIGH (ref 4.8–5.6)

## 2019-08-18 LAB — COMPREHENSIVE METABOLIC PANEL
ALT: 22 IU/L (ref 0–32)
AST: 23 IU/L (ref 0–40)
Albumin/Globulin Ratio: 1.4 (ref 1.2–2.2)
Albumin: 4.4 g/dL (ref 3.8–4.9)
Alkaline Phosphatase: 140 IU/L — ABNORMAL HIGH (ref 39–117)
BUN/Creatinine Ratio: 10 (ref 9–23)
BUN: 10 mg/dL (ref 6–24)
Bilirubin Total: 0.6 mg/dL (ref 0.0–1.2)
CO2: 24 mmol/L (ref 20–29)
Calcium: 9.9 mg/dL (ref 8.7–10.2)
Chloride: 99 mmol/L (ref 96–106)
Creatinine, Ser: 0.99 mg/dL (ref 0.57–1.00)
GFR calc Af Amer: 73 mL/min/{1.73_m2} (ref >59)
GFR calc non Af Amer: 63 mL/min/{1.73_m2} (ref >59)
Globulin, Total: 3.2 g/dL (ref 1.5–4.5)
Glucose: 101 mg/dL — ABNORMAL HIGH (ref 65–99)
Potassium: 4.5 mmol/L (ref 3.5–5.2)
Sodium: 136 mmol/L (ref 134–144)
Total Protein: 7.6 g/dL (ref 6.0–8.5)

## 2019-08-18 LAB — HEPATITIS C ANTIBODY: Hep C Virus Ab: <0.1 s/co ratio (ref 0.0–0.9)

## 2019-08-18 LAB — TSH+FREE T4
Free T4: 1.04 ng/dL (ref 0.82–1.77)
TSH: 1.8 u[IU]/mL (ref 0.450–4.500)

## 2019-08-18 LAB — VITAMIN B12: Vitamin B-12: 440 pg/mL (ref 232–1245)

## 2019-08-20 ENCOUNTER — Other Ambulatory Visit: Payer: Self-pay

## 2019-08-20 ENCOUNTER — Ambulatory Visit
Admission: RE | Admit: 2019-08-20 | Discharge: 2019-08-20 | Disposition: A | Discharge status: Home / Self Care | Payer: 59 | Source: Ambulatory Visit | Attending: Internal Medicine | Admitting: Internal Medicine

## 2019-08-20 DIAGNOSIS — R1011 Right upper quadrant pain: Secondary | ICD-10-CM | POA: Diagnosis present

## 2019-08-21 ENCOUNTER — Other Ambulatory Visit: Payer: Self-pay | Admitting: Internal Medicine

## 2019-08-21 DIAGNOSIS — E039 Hypothyroidism, unspecified: Secondary | ICD-10-CM

## 2019-08-21 MED ORDER — SYNTHROID 100 MCG PO TABS: 100.0000 ug | ORAL_TABLET | Freq: Every day | ORAL | 1 refills | Status: DC

## 2019-08-21 NOTE — Progress Notes (Signed)
Pt informed. Does not want any referrals at this time. Would like to change her medication to [facility].  Please Advise.

## 2019-09-21 ENCOUNTER — Other Ambulatory Visit: Payer: Self-pay

## 2019-09-21 DIAGNOSIS — E039 Hypothyroidism, unspecified: Secondary | ICD-10-CM

## 2019-09-21 MED ORDER — SYNTHROID 100 MCG PO TABS: 100.0000 ug | ORAL_TABLET | Freq: Every day | ORAL | 1 refills | Status: AC

## 2019-10-19 ENCOUNTER — Other Ambulatory Visit: Payer: Self-pay | Admitting: Family Medicine

## 2019-10-19 DIAGNOSIS — Z1231 Encounter for screening mammogram for malignant neoplasm of breast: Secondary | ICD-10-CM

## 2019-10-27 ENCOUNTER — Other Ambulatory Visit: Payer: Self-pay | Admitting: Family Medicine

## 2019-10-30 ENCOUNTER — Ambulatory Visit
Admission: RE | Admit: 2019-10-30 | Discharge: 2019-10-30 | Disposition: A | Discharge status: Home / Self Care | Payer: 59 | Source: Ambulatory Visit | Attending: Family Medicine | Admitting: Family Medicine

## 2019-10-30 ENCOUNTER — Other Ambulatory Visit: Payer: Self-pay

## 2019-10-30 DIAGNOSIS — Z1231 Encounter for screening mammogram for malignant neoplasm of breast: Secondary | ICD-10-CM

## 2021-02-20 ENCOUNTER — Other Ambulatory Visit: Payer: Self-pay | Admitting: Allergy and Immunology

## 2021-02-20 ENCOUNTER — Other Ambulatory Visit: Payer: Self-pay | Admitting: Orthopedic Surgery

## 2021-02-20 DIAGNOSIS — Z1231 Encounter for screening mammogram for malignant neoplasm of breast: Secondary | ICD-10-CM

## 2021-03-01 ENCOUNTER — Inpatient Hospital Stay: Admission: RE | Admit: 2021-03-01 | Payer: Managed Care, Other (non HMO) | Source: Ambulatory Visit

## 2021-04-20 ENCOUNTER — Ambulatory Visit: Payer: Managed Care, Other (non HMO)

## 2021-04-22 ENCOUNTER — Inpatient Hospital Stay: Admission: RE | Admit: 2021-04-22 | Payer: Managed Care, Other (non HMO) | Source: Ambulatory Visit

## 2022-05-22 ENCOUNTER — Other Ambulatory Visit: Payer: Self-pay | Admitting: Family Medicine

## 2022-05-22 DIAGNOSIS — Z1231 Encounter for screening mammogram for malignant neoplasm of breast: Secondary | ICD-10-CM

## 2022-08-27 ENCOUNTER — Ambulatory Visit: Payer: Managed Care, Other (non HMO)

## 2022-10-16 ENCOUNTER — Ambulatory Visit
Admission: RE | Admit: 2022-10-16 | Discharge: 2022-10-16 | Disposition: A | Discharge status: Home / Self Care | Payer: 59 | Source: Ambulatory Visit | Attending: Family Medicine | Admitting: Family Medicine

## 2022-10-16 DIAGNOSIS — Z1231 Encounter for screening mammogram for malignant neoplasm of breast: Secondary | ICD-10-CM

## 2023-11-27 ENCOUNTER — Other Ambulatory Visit: Payer: Self-pay | Admitting: Family Medicine

## 2023-11-27 DIAGNOSIS — Z1231 Encounter for screening mammogram for malignant neoplasm of breast: Secondary | ICD-10-CM

## 2023-11-28 ENCOUNTER — Ambulatory Visit
Admission: RE | Admit: 2023-11-28 | Discharge: 2023-11-28 | Disposition: A | Discharge status: Home / Self Care | Source: Ambulatory Visit | Attending: Family Medicine | Admitting: Family Medicine

## 2023-11-28 DIAGNOSIS — Z1231 Encounter for screening mammogram for malignant neoplasm of breast: Secondary | ICD-10-CM

## 2024-06-15 NOTE — Progress Notes (Deleted)
 Cardiology Office Note  Date:  06/15/2024   ID:  Cheryl Figueroa, DOB August 15, 1960, MRN [id number]  PCP:  Justus Leita DEL, MD   No chief complaint on file.   HPI:  Cheryl Figueroa is a 63 y.o. female with past medical history of: Past Medical History:  Diagnosis Date   Family history of adverse reaction to anesthesia    Mother - PONV   Fibromyalgia    Hyperlipidemia    Intermittent palpitations    Menopause    Minor depression    Motion sickness    back seat cars   Multilevel degenerative disc disease    Orthodontics    upper braces, lower permanent retainer   PONV (postoperative nausea and vomiting)    Sleep apnea    in past - no CPAP   Vitamin D deficiency    Wears contact lenses   Previously seen by myself in clinic July 2016 for chest pain and shortness of breath Who presents for new patient evaluation of her   Calcium score July 2016 Score of 0   PMH:   has a past medical history of Family history of adverse reaction to anesthesia, Fibromyalgia, Hyperlipidemia, Intermittent palpitations, Menopause, Minor depression, Motion sickness, Multilevel degenerative disc disease, Orthodontics, PONV (postoperative nausea and vomiting), Sleep apnea, Vitamin D deficiency, and Wears contact lenses.   PSH:    Past Surgical History:  Procedure Laterality Date   ANTERIOR CERVICAL DECOMP/DISCECTOMY FUSION  2005, 2009   carpal tunnel Left 12/02/2018   CESAREAN SECTION     COLONOSCOPY WITH PROPOFOL  N/A 11/02/2016   Procedure: COLONOSCOPY WITH PROPOFOL ;  Surgeon: Rogelia Copping, MD;  Location: [facility];  Service: Endoscopy;  Laterality: N/A;   POLYPECTOMY  11/02/2016   Procedure: POLYPECTOMY;  Surgeon: Rogelia Copping, MD;  Location: [facility];  Service: Endoscopy;;   TONSILLECTOMY      Current Outpatient Medications  Medication Sig Dispense Refill   DULoxetine  (CYMBALTA ) 30 MG capsule Take 3 capsules (90 mg total) by mouth daily. 90 capsule 3   ibuprofen  (ADVIL,MOTRIN) 800 MG tablet Take 800 mg by mouth every 8 (eight) hours as needed for moderate pain.     SYNTHROID  100 MCG tablet Take 1 tablet (100 mcg total) by mouth daily before breakfast. 90 tablet 1   No current facility-administered medications for this visit.     Allergies:   Codeine, Meperidine, and Oxycodone hcl   Social History:  The patient  reports that she has never smoked. She has never used smokeless tobacco. She reports that she does not drink alcohol and does not use drugs.   Family History:   family history includes Diabetes in her brother, father, paternal aunt, and paternal uncle; Hypertension in her mother; Ovarian cancer in her cousin and maternal grandmother; Thyroid  cancer in her cousin.    Review of Systems: ROS   PHYSICAL EXAM: VS:  LMP 10/26/2016 (Exact Date)  , BMI There is no height or weight on file to calculate BMI. GEN: Well nourished, well developed, in no acute distress HEENT: normal Neck: no JVD, carotid bruits, or masses Cardiac: RRR; no murmurs, rubs, or gallops,no edema  Respiratory:  clear to auscultation bilaterally, normal work of breathing GI: soft, nontender, nondistended, + BS MS: no deformity or atrophy Skin: warm and dry, no rash Neuro:  Strength and sensation are intact Psych: euthymic mood, full affect    Recent Labs: No results found for requested labs within last 365 days.  Lipid Panel Lab Results  Component Value Date   CHOL 242 (H) 09/22/2018   HDL 42 09/22/2018   LDLCALC 143 (H) 09/22/2018   TRIG 285 (H) 09/22/2018      Wt Readings from Last 3 Encounters:  08/17/19 229 lb (103.9 kg)  12/23/18 230 lb (104.3 kg)  09/22/18 228 lb (103.4 kg)       ASSESSMENT AND PLAN:  Problem List Items Addressed This Visit   None    Disposition:   F/U  12 months   Total encounter time more than 30 minutes  Greater than 50% was spent in counseling and coordination of care with the patient    Signed, Velinda Lunger,  M.D., Ph.D. [facility] [place], [place] [phone]

## 2024-06-16 ENCOUNTER — Ambulatory Visit: Admitting: Cardiovascular Disease

## 2024-06-16 DIAGNOSIS — R0789 Other chest pain: Secondary | ICD-10-CM

## 2024-06-16 DIAGNOSIS — G4733 Obstructive sleep apnea (adult) (pediatric): Secondary | ICD-10-CM

## 2024-06-16 DIAGNOSIS — Z8249 Family history of ischemic heart disease and other diseases of the circulatory system: Secondary | ICD-10-CM

## 2024-06-16 DIAGNOSIS — R0602 Shortness of breath: Secondary | ICD-10-CM

## 2024-06-16 DIAGNOSIS — R7303 Prediabetes: Secondary | ICD-10-CM

## 2024-06-16 DIAGNOSIS — E782 Mixed hyperlipidemia: Secondary | ICD-10-CM

## 2024-06-28 NOTE — Progress Notes (Deleted)
 Cardiology Office Note  Date:  06/28/2024   ID:  Ona, Roehrs [birth year], MRN [id number]  PCP:  Justus Leita DEL, MD   No chief complaint on file.   HPI:  Cheryl Figueroa is a 63 y.o. female with past medical history of: Past Medical History:  Diagnosis Date   Family history of adverse reaction to anesthesia    Mother - PONV   Fibromyalgia    Hyperlipidemia    Intermittent palpitations    Menopause    Minor depression    Motion sickness    back seat cars   Multilevel degenerative disc disease    Orthodontics    upper braces, lower permanent retainer   PONV (postoperative nausea and vomiting)    Sleep apnea    in past - no CPAP   Vitamin D deficiency    Wears contact lenses   Previously seen in clinic July 2016 for chest pain and shortness of breath  CT calcium score July 2016 score 0  Lab work reviewed A1c 5.7 Total cholesterol 246 up from 170 LDL 153   PMH:   has a past medical history of Family history of adverse reaction to anesthesia, Fibromyalgia, Hyperlipidemia, Intermittent palpitations, Menopause, Minor depression, Motion sickness, Multilevel degenerative disc disease, Orthodontics, PONV (postoperative nausea and vomiting), Sleep apnea, Vitamin D deficiency, and Wears contact lenses.   PSH:    Past Surgical History:  Procedure Laterality Date   ANTERIOR CERVICAL DECOMP/DISCECTOMY FUSION  2005, 2009   carpal tunnel Left 12/02/2018   CESAREAN SECTION     COLONOSCOPY WITH PROPOFOL  N/A 11/02/2016   Procedure: COLONOSCOPY WITH PROPOFOL ;  Surgeon: Rogelia Copping, MD;  Location: [facility];  Service: Endoscopy;  Laterality: N/A;   POLYPECTOMY  11/02/2016   Procedure: POLYPECTOMY;  Surgeon: Rogelia Copping, MD;  Location: [facility];  Service: Endoscopy;;   TONSILLECTOMY      Current Outpatient Medications  Medication Sig Dispense Refill   DULoxetine  (CYMBALTA ) 30 MG capsule Take 3 capsules (90 mg total) by mouth daily. 90 capsule 3    ibuprofen (ADVIL,MOTRIN) 800 MG tablet Take 800 mg by mouth every 8 (eight) hours as needed for moderate pain.     SYNTHROID  100 MCG tablet Take 1 tablet (100 mcg total) by mouth daily before breakfast. 90 tablet 1   No current facility-administered medications for this visit.     Allergies:   Codeine, Meperidine, and Oxycodone hcl   Social History:  The patient  reports that she has never smoked. She has never used smokeless tobacco. She reports that she does not drink alcohol and does not use drugs.   Family History:   family history includes Diabetes in her brother, father, paternal aunt, and paternal uncle; Hypertension in her mother; Ovarian cancer in her cousin and maternal grandmother; Thyroid  cancer in her cousin.    Review of Systems: ROS   PHYSICAL EXAM: VS:  LMP 10/26/2016 (Exact Date)  , BMI There is no height or weight on file to calculate BMI. GEN: Well nourished, well developed, in no acute distress HEENT: normal Neck: no JVD, carotid bruits, or masses Cardiac: RRR; no murmurs, rubs, or gallops,no edema  Respiratory:  clear to auscultation bilaterally, normal work of breathing GI: soft, nontender, nondistended, + BS MS: no deformity or atrophy Skin: warm and dry, no rash Neuro:  Strength and sensation are intact Psych: euthymic mood, full affect    Recent Labs: No results found for requested labs within last 365 days.  Lipid Panel Lab Results  Component Value Date   CHOL 242 (H) 09/22/2018   HDL 42 09/22/2018   LDLCALC 143 (H) 09/22/2018   TRIG 285 (H) 09/22/2018      Wt Readings from Last 3 Encounters:  08/17/19 229 lb (103.9 kg)  12/23/18 230 lb (104.3 kg)  09/22/18 228 lb (103.4 kg)       ASSESSMENT AND PLAN:  Problem List Items Addressed This Visit   None    Disposition:   F/U  12 months   Total encounter time more than 30 minutes  Greater than 50% was spent in counseling and coordination of care with the  patient    Signed, Velinda Lunger, M.D., Ph.D. [facility] [place], [place] [phone]

## 2024-06-29 ENCOUNTER — Ambulatory Visit: Admitting: Cardiovascular Disease

## 2024-06-29 DIAGNOSIS — R079 Chest pain, unspecified: Secondary | ICD-10-CM

## 2024-06-29 DIAGNOSIS — E782 Mixed hyperlipidemia: Secondary | ICD-10-CM

## 2024-06-29 DIAGNOSIS — G4733 Obstructive sleep apnea (adult) (pediatric): Secondary | ICD-10-CM

## 2024-06-29 DIAGNOSIS — R7303 Prediabetes: Secondary | ICD-10-CM

## 2024-08-08 ENCOUNTER — Other Ambulatory Visit: Payer: Self-pay

## 2024-08-08 ENCOUNTER — Emergency Department: Admission: EM | Admit: 2024-08-08 | Discharge: 2024-08-08 | Disposition: A | Discharge status: Home / Self Care

## 2024-08-08 ENCOUNTER — Emergency Department

## 2024-08-08 ENCOUNTER — Ambulatory Visit: Admission: EM | Admit: 2024-08-08 | Discharge: 2024-08-08 | Attending: Family Medicine | Admitting: Family Medicine

## 2024-08-08 DIAGNOSIS — S0083XA Contusion of other part of head, initial encounter: Secondary | ICD-10-CM

## 2024-08-08 DIAGNOSIS — S0011XA Contusion of right eyelid and periocular area, initial encounter: Secondary | ICD-10-CM | POA: Insufficient documentation

## 2024-08-08 DIAGNOSIS — M79642 Pain in left hand: Secondary | ICD-10-CM

## 2024-08-08 DIAGNOSIS — W108XXA Fall (on) (from) other stairs and steps, initial encounter: Secondary | ICD-10-CM | POA: Diagnosis not present

## 2024-08-08 DIAGNOSIS — S6992XA Unspecified injury of left wrist, hand and finger(s), initial encounter: Secondary | ICD-10-CM | POA: Diagnosis not present

## 2024-08-08 DIAGNOSIS — S60512A Abrasion of left hand, initial encounter: Secondary | ICD-10-CM | POA: Diagnosis not present

## 2024-08-08 DIAGNOSIS — M79643 Pain in unspecified hand: Secondary | ICD-10-CM

## 2024-08-08 DIAGNOSIS — W19XXXA Unspecified fall, initial encounter: Secondary | ICD-10-CM | POA: Diagnosis not present

## 2024-08-08 DIAGNOSIS — M25531 Pain in right wrist: Secondary | ICD-10-CM | POA: Diagnosis not present

## 2024-08-08 DIAGNOSIS — S0990XA Unspecified injury of head, initial encounter: Secondary | ICD-10-CM | POA: Diagnosis not present

## 2024-08-08 DIAGNOSIS — S0993XA Unspecified injury of face, initial encounter: Secondary | ICD-10-CM | POA: Diagnosis present

## 2024-08-08 DIAGNOSIS — M542 Cervicalgia: Secondary | ICD-10-CM | POA: Diagnosis not present

## 2024-08-08 NOTE — ED Triage Notes (Addendum)
 Patient fell yesterday injuring her left hand.patient takes an Asprin Patient did hit her head. Patient injured her right eye. Patient has a black eye,

## 2024-08-08 NOTE — ED Provider Notes (Signed)
 " Cheryl Figueroa    CSN: [id number] Arrival date & time: 08/08/24  1432      History   Chief Complaint Chief Complaint  Patient presents with   Fall    HPI  HPI Cheryl Figueroa is a 64 y.o. female.   Cheryl Figueroa presents after an accident at their RV. One of the dog's requests to be carried up the steps.  She grabbed the handrail and was going up the step. She thinks her left foot missed the step and she twisted down on her right side.  Her head hit the door and has a black eye.  She believes she hit her thigh on the rail and has slight pain there. She landed on her left hand.  She is right handed. Says she had to use the left hand because the rail on the left side and she was holding the dog on the right side. Has swelling and bruising on her left hand. She put some ice on it which helped the swelling.    She is concerned that she may have moved the hardware in her neck as she is having neck pain.      Past Medical History:  Diagnosis Date   Family history of adverse reaction to anesthesia    Mother - PONV   Fibromyalgia    Hyperlipidemia    Intermittent palpitations    Menopause    Minor depression    Motion sickness    back seat cars   Multilevel degenerative disc disease    Orthodontics    upper braces, lower permanent retainer   PONV (postoperative nausea and vomiting)    Sleep apnea    in past - no CPAP   Vitamin D deficiency    Wears contact lenses     Patient Active Problem List   Diagnosis Date Noted   Pre-diabetes 08/17/2019   Bilateral carpal tunnel syndrome 06/19/2018   Acquired hypothyroidism 04/15/2017   Anxiety 02/11/2017   Palpitations 02/11/2017   FHx: hemochromatosis 02/11/2017   Arthralgia 02/11/2017   Fatigue 02/11/2017   OSA (obstructive sleep apnea) 02/11/2017   Special screening for malignant neoplasms, colon    Benign neoplasm of descending colon    Polyp of sigmoid colon    Cervical back pain with evidence of disc disease  08/20/2016   Slow transit constipation 06/27/2016   Mood disorder 06/27/2016   Fibromyalgia 11/29/2015   Atypical chest pain 06/18/2015   Pain in the chest 02/01/2015   SOB (shortness of breath) 02/01/2015   Family history of early CAD 02/01/2015   Hyperlipidemia 02/01/2015   Obesity 02/01/2015    Past Surgical History:  Procedure Laterality Date   ANTERIOR CERVICAL DECOMP/DISCECTOMY FUSION  2005, 2009   carpal tunnel Left 12/02/2018   CESAREAN SECTION     COLONOSCOPY WITH PROPOFOL  N/A 11/02/2016   Procedure: COLONOSCOPY WITH PROPOFOL ;  Surgeon: Rogelia Copping, MD;  Location: [facility];  Service: Endoscopy;  Laterality: N/A;   POLYPECTOMY  11/02/2016   Procedure: POLYPECTOMY;  Surgeon: Rogelia Copping, MD;  Location: [facility];  Service: Endoscopy;;   TONSILLECTOMY      OB History   No obstetric history on file.      Home Medications    Prior to Admission medications  Medication Sig Start Date End Date Taking? Authorizing Provider  aspirin EC 81 MG tablet Take 81 mg by mouth.   Yes [provider]  DULoxetine  (CYMBALTA ) 30 MG capsule Take 3 capsules (90  mg total) by mouth daily. 08/17/19  Yes Justus Leita DEL, MD  rosuvastatin (CRESTOR) 20 MG tablet Take 20 mg by mouth daily. 05/13/23  Yes [provider]  semaglutide-weight management (WEGOVY) 0.5 MG/0.5ML SOAJ SQ injection Inject 0.5 mg into the skin. 06/02/24 06/02/25 Yes [provider]  SYNTHROID  100 MCG tablet Take 1 tablet (100 mcg total) by mouth daily before breakfast. 09/21/19  Yes Justus Leita DEL, MD  buPROPion  ER (WELLBUTRIN  SR) 100 MG 12 hr tablet TAKE 1 TABLET BY MOUTH ONCE DAILY FOR 7 DAYS THEN 1 TABLET TWICE DAILY FOR 7 DAYS THEN 1 TABLET THREE TIMES DAILY FOR 14 DAYS    [provider]  ibuprofen (ADVIL,MOTRIN) 800 MG tablet Take 800 mg by mouth every 8 (eight) hours as needed for moderate pain.    [provider]  olmesartan (BENICAR) 20 MG tablet Take 20  mg by mouth daily.    [provider]  sertraline (ZOLOFT) 25 MG tablet     [provider]    Family History Family History  Problem Relation Age of Onset   Hypertension Mother    Diabetes Father    Diabetes Paternal Aunt    Diabetes Paternal Uncle    Ovarian cancer Maternal Grandmother    Thyroid  cancer Cousin    Ovarian cancer Cousin    Diabetes Brother    Breast cancer Neg Hx     Social History Social History[1]   Allergies   Codeine, Meperidine, and Oxycodone hcl   Review of Systems Review of Systems: :negative unless otherwise stated in HPI.      Physical Exam Triage Vital Signs ED Triage Vitals  Encounter Vitals Group     BP      Girls Systolic BP Percentile      Girls Diastolic BP Percentile      Boys Systolic BP Percentile      Boys Diastolic BP Percentile      Pulse      Resp      Temp      Temp src      SpO2      Weight      Height      Head Circumference      Peak Flow      Pain Score      Pain Loc      Pain Education      Exclude from Growth Chart    No data found.  Updated Vital Signs BP 138/81 (BP Location: Right Arm)   Pulse 83   Temp 99.1 F (37.3 C) (Oral)   Resp 17   Wt 99.8 kg   LMP 10/26/2016   SpO2 96%   BMI 40.24 kg/m   Visual Acuity Right Eye Distance:   Left Eye Distance:   Bilateral Distance:    Right Eye Near:   Left Eye Near:    Bilateral Near:     Physical Exam GEN: well appearing female in no acute distress  HENT: ecchymosis and erythema of left eyelid with frontotemporal bony TTP, midline frontal bony mass is not new per patient  CVS: well perfused  RESP: speaking in full sentences without pause, no respiratory distress  MSK:   Left  Hand / Wrist : Inspection: No obvious deformity but as edema and dorsal ecchymosis across the 1st-4th metacarpals  Palpation: 3rd-5th metacarpal TTP, mild distal forearm and wrist TTP  ROM: good ROM of the digits and limited ROM wrist. + swelling at the  DIP joints to wrist. Flexor digitorum profundus and superficialis tendon functions are intact.  PIP joint collateral ligaments are stable  Strength: 5/5 strength in the forearm, wrist and interosseus muscles b/l Neurovascular: NV intact b/l  Spine: surgical scar, paraspinal TTP with midline C-spine tenderness and upper T spine midline TTP       UC Treatments / Results  Labs (all labs ordered are listed, but only abnormal results are displayed) Labs Reviewed - No data to display  EKG   Radiology No results found.   Procedures Procedures (including critical Figueroa time)  Medications Ordered in UC Medications - No data to display  Initial Impression / Assessment and Plan / UC Course  I have reviewed the triage vital signs and the nursing notes.  Pertinent labs & imaging results that were available during my Figueroa of the patient were reviewed by me and considered in my medical decision making (see chart for details).      Pt is a 64 y.o.  female here after a fall yesterday injuring her left hand, head and neck.   On exam, pt has midline C-spine and T-spine tenderness with right orbital ecchymosis and erythema with swelling. Additionally has abrasions, edema and ecchymosis overlying her left hand concerning for possible open fracture.     Discussed utility in ED evaluation to better evaluate her head injuries and possible open fracture. After shared decision making with her husband she is amendable to ED evaluation. She will travel to [facility] ED for a higher level of imaging that is not available here. Called and spoke with triage RN at [facility] ED who will await pt's arrival.     Discussed MDM, treatment plan and plan for follow-up with patient and her husband who agree with plan.   Final Clinical Impressions(s) / UC Diagnoses   Final diagnoses:  Fall, initial encounter  Contusion of face, initial encounter  Injury of head, initial encounter  Injury of left hand, initial encounter   Neck pain     Discharge Instructions      You have been advised to follow up immediately in the emergency department for concerning signs or symptoms as discussed during your visit. If you declined EMS transport, please have a family member take you directly to the ED at this time. Do not delay.   Based on concerns about condition, if you do not follow up in the ED, you may risk poor outcomes including worsening of condition, delayed treatment and potentially life threatening issues. If you have declined to go to the ED at this time, you should call your PCP immediately to set up a follow up appointment.   Go to ED for red flag symptoms, including; fevers you cannot reduce with Tylenol /Motrin, severe headaches, vision changes, numbness/weakness in part of the body, lethargy, confusion, intractable vomiting, severe dehydration, chest pain, breathing difficulty, severe persistent abdominal or pelvic pain, signs of severe infection (increased redness, swelling of an area), feeling faint or passing out, dizziness, etc. You should especially go to the ED for sudden acute worsening of condition if you do not elect to go at this time.       ED Prescriptions   None    PDMP not reviewed this encounter.     [1]  Social History Tobacco Use   Smoking status: Never   Smokeless tobacco: Never  Vaping Use   Vaping status: Never Used  Substance Use Topics   Alcohol use: No    Alcohol/week: 0.0 standard drinks  of alcohol   Drug use: No     Kriste Berth, DO 08/08/24 1556  "

## 2024-08-08 NOTE — ED Triage Notes (Signed)
 First nurse note: Pt to ED via POV from [facility]. Pt reports fall that caused her to injure her right eye and left hand. Pt did hit her head. No LOC. No blood thinners. Eye is black. Pt sent due to possible open fracture of hand.

## 2024-08-08 NOTE — ED Triage Notes (Addendum)
 Pt to ED with husband for mechanical fall yesterday. Slipped/tripped. Fell onto doorframe of RV. Hit R face (has periorbital bruising), L hand (swelling and bruising noted). R neck pain (neck was thrown back when she hit her face) No cervical spine tenderness. States concerned because has metal plates in neck.  Abrasion to L hand.  Spoke with Dr Nicholaus, verbal orders for scans.

## 2024-08-08 NOTE — ED Notes (Signed)
 Patient is being discharged from the Urgent Care and sent to the [facility] Emergency Department via private vehicle . Per Dr. Kriste, patient is in need of higher level of care due to fall with head injury. Patient is aware and verbalizes understanding of plan of care.  Vitals:   08/08/24 1451  BP: 138/81  Pulse: 83  Resp: 17  Temp: 99.1 F (37.3 C)  SpO2: 96%

## 2024-08-08 NOTE — ED Provider Notes (Signed)
 "  [facility] Provider Note    Event Date/Time   First MD Initiated Contact with Patient 08/08/24 1655     (approximate)   History   Fall and facial bruising   HPI  Cheryl Figueroa is a 64 y.o. female fibromyalgia, hyperlipidemia, depression, obesity     Physical Exam   Triage Vital Signs: ED Triage Vitals  Encounter Vitals Group     BP 08/08/24 1613 (!) 166/105     Girls Systolic BP Percentile --      Girls Diastolic BP Percentile --      Boys Systolic BP Percentile --      Boys Diastolic BP Percentile --      Pulse Rate 08/08/24 1613 80     Resp 08/08/24 1613 20     Temp 08/08/24 1613 98.8 F (37.1 C)     Temp Source 08/08/24 1613 Oral     SpO2 08/08/24 1613 97 %     Weight 08/08/24 1617 200 lb (90.7 kg)     Height 08/08/24 1617 5' (1.524 m)     Head Circumference --      Peak Flow --      Pain Score 08/08/24 1614 7     Pain Loc --      Pain Education --      Exclude from Growth Chart --     Most recent vital signs: Vitals:   08/08/24 1613  BP: (!) 166/105  Pulse: 80  Resp: 20  Temp: 98.8 F (37.1 C)  SpO2: 97%    Nursing Triage Note reviewed. Vital signs reviewed and patients oxygen saturation is normoxic***  General: Patient is well nourished, well developed, awake and alert, resting comfortably in no acute distress Head: Normocephalic and atraumatic Eyes: Normal inspection, extraocular muscles intact, no conjunctival pallor Ear, nose, throat: Normal external exam Neck: Normal range of motion Respiratory: Patient is in no respiratory distress, lungs CTAB Cardiovascular: Patient is not tachycardic, RRR without murmur appreciated GI: Abd SNT with no guarding or rebound  Back: Normal inspection of the back with good strength and range of motion throughout all ext Extremities: pulses intact with good cap refills, no LE pitting edema or calf tenderness Neuro: The patient is alert and oriented to person, place, and time,  appropriately conversive, with 5/5 bilat UE/LE strength, no gross motor or sensory defects noted. Coordination appears to be adequate. Skin: Warm, dry, and intact Psych: normal mood and affect, no SI or HI  ED Results / Procedures / Treatments   Labs (all labs ordered are listed, but only abnormal results are displayed) Labs Reviewed - No data to display   EKG   RADIOLOGY ***    PROCEDURES:  Critical Care performed: {CriticalCareYesNo:19197::Yes, see critical care procedure note(s),No}  Procedures   MEDICATIONS ORDERED IN ED: Medications - No data to display   IMPRESSION / MDM / ASSESSMENT AND PLAN / ED COURSE                                Differential diagnosis includes, but is not limited to, ***    ***   Clinical Course as of 08/08/24 1725  Sat Aug 08, 2024  1719 CT Cervical Spine Wo Contrast No acute fracture [HD]  1719 CT Maxillofacial Wo Contrast No acute fracture [HD]  1719 CT Head Wo Contrast No intracranial hemorrhage [HD]  1720 DG Hand Complete Left No visible  fracture [HD]  1720 Patient does have snuffbox tenderness so we will place her in a removable spica splint but patient can likely follow-up with orthopedics if pain does not improve in 10 days [HD]  1722 Patient counseled on results feels comfortable with following up with orthopedics given the snuffbox tenderness.  All questions answered and patient voiced understanding [HD]    Clinical Course User Index [HD] Nicholaus Rolland BRAVO, MD   -- Risk: 5 This patient has a high risk of morbidity due to further diagnostic testing or treatment. Rationale: This patients evaluation and management involve a high risk of morbidity due to the potential severity of presenting symptoms, need for diagnostic testing, and/or initiation of treatment that may require close monitoring. The differential includes conditions with potential for significant deterioration or requiring escalation of care. Treatment  decisions in the ED, including medication administration, procedural interventions, or disposition planning, reflect this level of risk. COPA: 5 The patient has the following acute or chronic illness/injury that poses a possible threat to life or bodily function: [X] : The patient has a potentially serious acute condition or an acute exacerbation of a chronic illness requiring urgent evaluation and management in the Emergency Department. The clinical presentation necessitates immediate consideration of life-threatening or function-threatening diagnoses, even if they are ultimately ruled out.   FINAL CLINICAL IMPRESSION(S) / ED DIAGNOSES   Final diagnoses:  None     Rx / DC Orders   ED Discharge Orders     None        Note:  This document was prepared using Dragon voice recognition software and may include unintentional dictation errors. "

## 2024-08-08 NOTE — Discharge Instructions (Signed)

## 2024-08-08 NOTE — Discharge Instructions (Signed)
 You were seen in the emergency department after a fall from standing yesterday evening.  Imaging today looked well however you did have tenderness in your anatomical snuffbox.  It can take up to 10 days for small fractures to p.o. on x-ray in this area.  Please wear your splint is much as possible but you can remove this for hygiene.  Please call and make an appointment to follow-up with orthopedics.  Return with any acutely worsening symptoms or any other emergency. -- \RETURN PRECAUTIONS & AFTERCARE: (ENGLISH) RETURN PRECAUTIONS: Return immediately to the emergency department or see/call your doctor if you feel worse, weak or have changes in speech or vision, are short of breath, have fever, vomiting, pain, bleeding or dark stool, trouble urinating or any new issues. Return here or see/call your doctor if not improving as expected for your suspected condition. FOLLOW-UP CARE: Call your doctor and/or any doctors we referred you to for more advice and to make an appointment. Do this today, tomorrow or after the weekend. Some doctors only take PPO insurance so if you have HMO insurance you may want to contact your HMO or your regular doctor for referral to a specialist within your plan. Either way tell the doctor's office that it was a referral from the emergency department so you get the soonest possible appointment.  YOUR TEST RESULTS: Take result reports of any blood or urine tests, imaging tests and EKG's to your doctor and any referral doctor. Have any abnormal tests repeated. Your doctor or a referral doctor can let you know when this should be done. Also make sure your doctor contacts this hospital to get any test results that are not currently available such as cultures or special tests for infection and final imaging reports, which are often not available at the time you leave the ER but which may list additional important findings that are not documented on the preliminary report. BLOOD PRESSURE: If  your blood pressure was greater than 120/80 have your blood pressure rechecked within 1 to 2 weeks. MEDICATION SIDE EFFECTS: Do not drive, walk, bike, take the bus, etc. if you have received or are being prescribed any sedating medications such as those for pain or anxiety or certain antihistamines like Benadryl. If you have been give one of these here get a taxi home or have a friend drive you home. Ask your pharmacist to counsel you on potential side effects of any new medication
